# Patient Record
Sex: Female | Born: 1950 | Race: White | Hispanic: No | Smoking: Former smoker
Health system: Southern US, Community
[De-identification: ages and names within clinical notes are randomized; demographics above are authoritative.]

## PROBLEM LIST (undated history)

## (undated) DIAGNOSIS — D649 Anemia, unspecified: Secondary | ICD-10-CM

## (undated) DIAGNOSIS — F329 Major depressive disorder, single episode, unspecified: Secondary | ICD-10-CM

## (undated) DIAGNOSIS — K219 Gastro-esophageal reflux disease without esophagitis: Secondary | ICD-10-CM

## (undated) DIAGNOSIS — Z5189 Encounter for other specified aftercare: Secondary | ICD-10-CM

## (undated) DIAGNOSIS — T7840XA Allergy, unspecified, initial encounter: Secondary | ICD-10-CM

## (undated) DIAGNOSIS — F419 Anxiety disorder, unspecified: Secondary | ICD-10-CM

## (undated) DIAGNOSIS — F32A Depression, unspecified: Secondary | ICD-10-CM

## (undated) HISTORY — DX: Anxiety disorder, unspecified: F41.9

## (undated) HISTORY — DX: Major depressive disorder, single episode, unspecified: F32.9

## (undated) HISTORY — PX: UPPER GASTROINTESTINAL ENDOSCOPY: SHX188

## (undated) HISTORY — DX: Depression, unspecified: F32.A

## (undated) HISTORY — PX: AUGMENTATION MAMMAPLASTY: SUR837

## (undated) HISTORY — DX: Gastro-esophageal reflux disease without esophagitis: K21.9

## (undated) HISTORY — PX: OTHER SURGICAL HISTORY: SHX169

## (undated) HISTORY — DX: Allergy, unspecified, initial encounter: T78.40XA

## (undated) HISTORY — DX: Anemia, unspecified: D64.9

## (undated) HISTORY — DX: Encounter for other specified aftercare: Z51.89

---

## 1998-02-10 HISTORY — PX: BREAST ENHANCEMENT SURGERY: SHX7

## 2001-03-11 ENCOUNTER — Encounter: Payer: Self-pay | Admitting: Internal Medicine

## 2001-04-01 ENCOUNTER — Encounter: Admission: RE | Admit: 2001-04-01 | Discharge: 2001-04-01 | Payer: Self-pay | Admitting: *Deleted

## 2001-04-01 ENCOUNTER — Encounter: Payer: Self-pay | Admitting: *Deleted

## 2001-11-22 ENCOUNTER — Other Ambulatory Visit: Admission: RE | Admit: 2001-11-22 | Discharge: 2001-11-22 | Payer: Self-pay | Admitting: Gynecology

## 2002-04-26 ENCOUNTER — Encounter: Payer: Self-pay | Admitting: Gynecology

## 2002-04-26 ENCOUNTER — Encounter: Admission: RE | Admit: 2002-04-26 | Discharge: 2002-04-26 | Payer: Self-pay | Admitting: Gynecology

## 2002-05-05 ENCOUNTER — Encounter: Payer: Self-pay | Admitting: Internal Medicine

## 2002-05-05 ENCOUNTER — Encounter: Admission: RE | Admit: 2002-05-05 | Discharge: 2002-05-05 | Payer: Self-pay | Admitting: Internal Medicine

## 2002-12-26 ENCOUNTER — Other Ambulatory Visit: Admission: RE | Admit: 2002-12-26 | Discharge: 2002-12-26 | Payer: Self-pay | Admitting: Gynecology

## 2003-04-27 ENCOUNTER — Encounter: Admission: RE | Admit: 2003-04-27 | Discharge: 2003-04-27 | Payer: Self-pay | Admitting: Gynecology

## 2003-06-27 ENCOUNTER — Encounter: Admission: RE | Admit: 2003-06-27 | Discharge: 2003-07-11 | Payer: Self-pay | Admitting: Neurology

## 2004-02-13 ENCOUNTER — Other Ambulatory Visit: Admission: RE | Admit: 2004-02-13 | Discharge: 2004-02-13 | Payer: Self-pay | Admitting: Gynecology

## 2004-04-29 ENCOUNTER — Encounter: Admission: RE | Admit: 2004-04-29 | Discharge: 2004-04-29 | Payer: Self-pay | Admitting: Internal Medicine

## 2005-05-01 ENCOUNTER — Encounter: Admission: RE | Admit: 2005-05-01 | Discharge: 2005-05-01 | Payer: Self-pay | Admitting: Internal Medicine

## 2005-06-05 ENCOUNTER — Other Ambulatory Visit: Admission: RE | Admit: 2005-06-05 | Discharge: 2005-06-05 | Payer: Self-pay | Admitting: Gynecology

## 2006-06-09 ENCOUNTER — Other Ambulatory Visit: Admission: RE | Admit: 2006-06-09 | Discharge: 2006-06-09 | Payer: Self-pay | Admitting: Gynecology

## 2006-06-11 ENCOUNTER — Encounter: Admission: RE | Admit: 2006-06-11 | Discharge: 2006-06-11 | Payer: Self-pay | Admitting: Gynecology

## 2007-09-08 ENCOUNTER — Encounter (INDEPENDENT_AMBULATORY_CARE_PROVIDER_SITE_OTHER): Payer: Self-pay | Admitting: *Deleted

## 2007-09-08 ENCOUNTER — Encounter: Admission: RE | Admit: 2007-09-08 | Discharge: 2007-09-08 | Payer: Self-pay | Admitting: Internal Medicine

## 2007-09-14 ENCOUNTER — Encounter: Admission: RE | Admit: 2007-09-14 | Discharge: 2007-09-14 | Payer: Self-pay | Admitting: Gynecology

## 2008-01-21 ENCOUNTER — Encounter: Payer: Self-pay | Admitting: Internal Medicine

## 2008-01-21 DIAGNOSIS — Q438 Other specified congenital malformations of intestine: Secondary | ICD-10-CM | POA: Insufficient documentation

## 2008-02-17 ENCOUNTER — Encounter: Admission: RE | Admit: 2008-02-17 | Discharge: 2008-02-17 | Payer: Self-pay | Admitting: Internal Medicine

## 2008-03-12 ENCOUNTER — Emergency Department (HOSPITAL_COMMUNITY): Admission: EM | Admit: 2008-03-12 | Discharge: 2008-03-12 | Payer: Self-pay | Admitting: Family Medicine

## 2008-09-19 ENCOUNTER — Encounter: Admission: RE | Admit: 2008-09-19 | Discharge: 2008-09-19 | Payer: Self-pay | Admitting: Internal Medicine

## 2008-11-10 HISTORY — PX: ESOPHAGOGASTRODUODENOSCOPY W/ BANDING: SHX1530

## 2008-11-27 ENCOUNTER — Inpatient Hospital Stay (HOSPITAL_COMMUNITY): Admission: EM | Admit: 2008-11-27 | Discharge: 2008-12-01 | Payer: Self-pay | Admitting: Emergency Medicine

## 2008-11-27 ENCOUNTER — Ambulatory Visit: Payer: Self-pay | Admitting: Internal Medicine

## 2008-11-28 ENCOUNTER — Encounter (INDEPENDENT_AMBULATORY_CARE_PROVIDER_SITE_OTHER): Payer: Self-pay | Admitting: *Deleted

## 2008-11-28 ENCOUNTER — Encounter: Payer: Self-pay | Admitting: Internal Medicine

## 2008-12-01 ENCOUNTER — Encounter: Payer: Self-pay | Admitting: Internal Medicine

## 2008-12-06 DIAGNOSIS — Z8719 Personal history of other diseases of the digestive system: Secondary | ICD-10-CM | POA: Insufficient documentation

## 2008-12-06 DIAGNOSIS — K449 Diaphragmatic hernia without obstruction or gangrene: Secondary | ICD-10-CM | POA: Insufficient documentation

## 2008-12-06 DIAGNOSIS — Z872 Personal history of diseases of the skin and subcutaneous tissue: Secondary | ICD-10-CM | POA: Insufficient documentation

## 2008-12-06 DIAGNOSIS — K219 Gastro-esophageal reflux disease without esophagitis: Secondary | ICD-10-CM | POA: Insufficient documentation

## 2008-12-06 DIAGNOSIS — D72829 Elevated white blood cell count, unspecified: Secondary | ICD-10-CM | POA: Insufficient documentation

## 2008-12-06 DIAGNOSIS — K222 Esophageal obstruction: Secondary | ICD-10-CM | POA: Insufficient documentation

## 2008-12-06 DIAGNOSIS — F411 Generalized anxiety disorder: Secondary | ICD-10-CM | POA: Insufficient documentation

## 2008-12-06 DIAGNOSIS — K648 Other hemorrhoids: Secondary | ICD-10-CM | POA: Insufficient documentation

## 2008-12-06 DIAGNOSIS — K5730 Diverticulosis of large intestine without perforation or abscess without bleeding: Secondary | ICD-10-CM | POA: Insufficient documentation

## 2008-12-06 DIAGNOSIS — D62 Acute posthemorrhagic anemia: Secondary | ICD-10-CM | POA: Insufficient documentation

## 2008-12-06 DIAGNOSIS — G4700 Insomnia, unspecified: Secondary | ICD-10-CM | POA: Insufficient documentation

## 2008-12-06 DIAGNOSIS — F3289 Other specified depressive episodes: Secondary | ICD-10-CM | POA: Insufficient documentation

## 2008-12-06 DIAGNOSIS — E785 Hyperlipidemia, unspecified: Secondary | ICD-10-CM | POA: Insufficient documentation

## 2008-12-06 DIAGNOSIS — F329 Major depressive disorder, single episode, unspecified: Secondary | ICD-10-CM | POA: Insufficient documentation

## 2008-12-12 ENCOUNTER — Ambulatory Visit: Payer: Self-pay | Admitting: Internal Medicine

## 2008-12-12 LAB — CONVERTED CEMR LAB
HCT: 39.5 % (ref 36.0–46.0)
Hemoglobin: 13.6 g/dL (ref 12.0–15.0)
Lymphocytes Relative: 32.5 % (ref 12.0–46.0)
Lymphs Abs: 1.7 10*3/uL (ref 0.7–4.0)
MCHC: 34.3 g/dL (ref 30.0–36.0)
Monocytes Absolute: 0.5 10*3/uL (ref 0.1–1.0)
Monocytes Relative: 9.7 % (ref 3.0–12.0)
Neutrophils Relative %: 53.1 % (ref 43.0–77.0)
Platelets: 471 10*3/uL — ABNORMAL HIGH (ref 150.0–400.0)

## 2009-01-29 ENCOUNTER — Ambulatory Visit: Payer: Self-pay | Admitting: Internal Medicine

## 2009-01-29 LAB — CONVERTED CEMR LAB
Eosinophils Absolute: 0.1 10*3/uL (ref 0.0–0.7)
Hemoglobin: 13.8 g/dL (ref 12.0–15.0)
Lymphs Abs: 1.3 10*3/uL (ref 0.7–4.0)
Neutro Abs: 2.1 10*3/uL (ref 1.4–7.7)
Platelets: 325 10*3/uL (ref 150.0–400.0)
RBC: 4.25 M/uL (ref 3.87–5.11)
RDW: 14.9 % — ABNORMAL HIGH (ref 11.5–14.6)

## 2009-10-23 ENCOUNTER — Encounter: Admission: RE | Admit: 2009-10-23 | Discharge: 2009-10-23 | Payer: Self-pay | Admitting: Gynecology

## 2010-03-03 ENCOUNTER — Encounter: Payer: Self-pay | Admitting: Internal Medicine

## 2010-03-04 ENCOUNTER — Encounter: Payer: Self-pay | Admitting: Internal Medicine

## 2010-05-16 LAB — SAMPLE TO BLOOD BANK

## 2010-05-16 LAB — CROSSMATCH
ABO/RH(D): O POS
Antibody Screen: NEGATIVE

## 2010-05-16 LAB — CBC
HCT: 15.8 % — ABNORMAL LOW (ref 36.0–46.0)
HCT: 34.5 % — ABNORMAL LOW (ref 36.0–46.0)
Hemoglobin: 12.4 g/dL (ref 12.0–15.0)
Hemoglobin: 13.7 g/dL (ref 12.0–15.0)
Hemoglobin: 5.4 g/dL — CL (ref 12.0–15.0)
MCHC: 33.9 g/dL (ref 30.0–36.0)
MCV: 93.2 fL (ref 78.0–100.0)
MCV: 94.8 fL (ref 78.0–100.0)
Platelets: 109 10*3/uL — ABNORMAL LOW (ref 150–400)
Platelets: 166 10*3/uL (ref 150–400)
Platelets: 174 10*3/uL (ref 150–400)
Platelets: 212 10*3/uL (ref 150–400)
RBC: 2.04 MIL/uL — ABNORMAL LOW (ref 3.87–5.11)
RBC: 4.01 MIL/uL (ref 3.87–5.11)
RDW: 13 % (ref 11.5–15.5)
RDW: 14 % (ref 11.5–15.5)
RDW: 15.3 % (ref 11.5–15.5)
RDW: 15.3 % (ref 11.5–15.5)
WBC: 6.2 10*3/uL (ref 4.0–10.5)
WBC: 8.9 10*3/uL (ref 4.0–10.5)

## 2010-05-16 LAB — HEPATIC FUNCTION PANEL
AST: 20 U/L (ref 0–37)
Albumin: 2.6 g/dL — ABNORMAL LOW (ref 3.5–5.2)
Alkaline Phosphatase: 35 U/L — ABNORMAL LOW (ref 39–117)
Bilirubin, Direct: 0.1 mg/dL (ref 0.0–0.3)
Indirect Bilirubin: 0.3 mg/dL (ref 0.3–0.9)
Total Bilirubin: 0.4 mg/dL (ref 0.3–1.2)

## 2010-05-16 LAB — BASIC METABOLIC PANEL
BUN: 9 mg/dL (ref 6–23)
Calcium: 7.6 mg/dL — ABNORMAL LOW (ref 8.4–10.5)
GFR calc non Af Amer: >60 mL/min (ref >60)
Glucose, Bld: 104 mg/dL — ABNORMAL HIGH (ref 70–99)
Potassium: 3.6 mEq/L (ref 3.5–5.1)

## 2010-05-16 LAB — COMPREHENSIVE METABOLIC PANEL
ALT: 12 U/L (ref 0–35)
ALT: 13 U/L (ref 0–35)
Albumin: 2.4 g/dL — ABNORMAL LOW (ref 3.5–5.2)
Alkaline Phosphatase: 22 U/L — ABNORMAL LOW (ref 39–117)
Alkaline Phosphatase: 30 U/L — ABNORMAL LOW (ref 39–117)
CO2: 31 mEq/L (ref 19–32)
GFR calc non Af Amer: >60 mL/min (ref >60)
Glucose, Bld: 139 mg/dL — ABNORMAL HIGH (ref 70–99)
Glucose, Bld: 99 mg/dL (ref 70–99)
Potassium: 3.3 mEq/L — ABNORMAL LOW (ref 3.5–5.1)
Potassium: 4.1 mEq/L (ref 3.5–5.1)
Sodium: 138 mEq/L (ref 135–145)
Sodium: 141 mEq/L (ref 135–145)
Total Protein: 4.2 g/dL — ABNORMAL LOW (ref 6.0–8.3)
Total Protein: 4.7 g/dL — ABNORMAL LOW (ref 6.0–8.3)

## 2010-05-16 LAB — HEMOGLOBIN AND HEMATOCRIT, BLOOD
HCT: 38.1 % (ref 36.0–46.0)
Hemoglobin: 12.4 g/dL (ref 12.0–15.0)
Hemoglobin: 12.7 g/dL (ref 12.0–15.0)

## 2010-05-16 LAB — DIFFERENTIAL
Basophils Relative: 0 % (ref 0–1)
Eosinophils Absolute: 0 10*3/uL (ref 0.0–0.7)
Lymphocytes Relative: 15 % (ref 12–46)
Lymphs Abs: 0.9 10*3/uL (ref 0.7–4.0)
Neutro Abs: 5.1 10*3/uL (ref 1.7–7.7)
Neutrophils Relative %: 80 % — ABNORMAL HIGH (ref 43–77)

## 2010-05-16 LAB — POCT I-STAT, CHEM 8
Chloride: 104 mEq/L (ref 96–112)
Sodium: 141 mEq/L (ref 135–145)
TCO2: 27 mmol/L (ref 0–100)

## 2010-05-16 LAB — HEMOCCULT GUIAC POC 1CARD (OFFICE): Fecal Occult Bld: POSITIVE

## 2010-05-16 LAB — H. PYLORI ANTIBODY, IGG: H Pylori IgG: 0.46 {ISR}

## 2010-05-16 LAB — PREPARE RBC (CROSSMATCH)

## 2010-05-27 LAB — D-DIMER, QUANTITATIVE: D-Dimer, Quant: 1.14 ug/mL-FEU — ABNORMAL HIGH (ref 0.00–0.48)

## 2010-05-27 LAB — DIFFERENTIAL
Basophils Absolute: 0 10*3/uL (ref 0.0–0.1)
Lymphocytes Relative: 20 % (ref 12–46)
Monocytes Absolute: 0.7 10*3/uL (ref 0.1–1.0)
Neutro Abs: 5.2 10*3/uL (ref 1.7–7.7)
Neutrophils Relative %: 69 % (ref 43–77)

## 2010-05-27 LAB — POCT I-STAT, CHEM 8
Chloride: 100 mEq/L (ref 96–112)
HCT: 37 % (ref 36.0–46.0)
Hemoglobin: 12.6 g/dL (ref 12.0–15.0)
Potassium: 3.7 mEq/L (ref 3.5–5.1)
Sodium: 140 mEq/L (ref 135–145)

## 2010-05-27 LAB — CBC
Hemoglobin: 12.4 g/dL (ref 12.0–15.0)
Platelets: 396 10*3/uL (ref 150–400)
RDW: 13.2 % (ref 11.5–15.5)

## 2011-01-01 ENCOUNTER — Other Ambulatory Visit: Payer: Self-pay | Admitting: Internal Medicine

## 2011-01-01 DIAGNOSIS — Z1231 Encounter for screening mammogram for malignant neoplasm of breast: Secondary | ICD-10-CM

## 2011-02-13 ENCOUNTER — Ambulatory Visit
Admission: RE | Admit: 2011-02-13 | Discharge: 2011-02-13 | Disposition: A | Discharge status: Home / Self Care | Payer: BC Managed Care – PPO | Source: Ambulatory Visit | Attending: Internal Medicine | Admitting: Internal Medicine

## 2011-02-13 DIAGNOSIS — Z1231 Encounter for screening mammogram for malignant neoplasm of breast: Secondary | ICD-10-CM

## 2012-02-12 ENCOUNTER — Other Ambulatory Visit: Payer: Self-pay | Admitting: Internal Medicine

## 2012-02-12 DIAGNOSIS — Z1231 Encounter for screening mammogram for malignant neoplasm of breast: Secondary | ICD-10-CM

## 2012-03-09 ENCOUNTER — Ambulatory Visit
Admission: RE | Admit: 2012-03-09 | Discharge: 2012-03-09 | Disposition: A | Discharge status: Home / Self Care | Payer: Self-pay | Source: Ambulatory Visit | Attending: Internal Medicine | Admitting: Internal Medicine

## 2012-03-09 DIAGNOSIS — Z1231 Encounter for screening mammogram for malignant neoplasm of breast: Secondary | ICD-10-CM

## 2013-03-09 ENCOUNTER — Other Ambulatory Visit: Payer: Self-pay

## 2013-03-09 DIAGNOSIS — Z1231 Encounter for screening mammogram for malignant neoplasm of breast: Secondary | ICD-10-CM

## 2013-04-05 ENCOUNTER — Ambulatory Visit: Payer: BC Managed Care – PPO

## 2013-04-18 ENCOUNTER — Ambulatory Visit
Admission: RE | Admit: 2013-04-18 | Discharge: 2013-04-18 | Disposition: A | Discharge status: Home / Self Care | Payer: BC Managed Care – PPO | Source: Ambulatory Visit

## 2013-04-18 DIAGNOSIS — Z1231 Encounter for screening mammogram for malignant neoplasm of breast: Secondary | ICD-10-CM

## 2013-07-06 ENCOUNTER — Encounter: Payer: Self-pay | Admitting: Gynecology

## 2013-07-06 ENCOUNTER — Ambulatory Visit: Payer: Self-pay | Admitting: Obstetrics and Gynecology

## 2013-07-28 ENCOUNTER — Ambulatory Visit: Payer: Self-pay | Admitting: Obstetrics and Gynecology

## 2013-08-03 ENCOUNTER — Encounter: Payer: Self-pay | Admitting: Gynecology

## 2013-09-21 ENCOUNTER — Encounter: Payer: Self-pay | Admitting: Gynecology

## 2013-09-21 ENCOUNTER — Ambulatory Visit (INDEPENDENT_AMBULATORY_CARE_PROVIDER_SITE_OTHER): Payer: BC Managed Care – PPO | Admitting: Gynecology

## 2013-09-21 ENCOUNTER — Telehealth: Payer: Self-pay | Admitting: Gynecology

## 2013-09-21 VITALS — BP 142/90 | HR 72 | Resp 18 | Ht 60.0 in | Wt 144.0 lb

## 2013-09-21 DIAGNOSIS — Z79890 Hormone replacement therapy (postmenopausal): Secondary | ICD-10-CM

## 2013-09-21 DIAGNOSIS — N951 Menopausal and female climacteric states: Secondary | ICD-10-CM

## 2013-09-21 DIAGNOSIS — Z124 Encounter for screening for malignant neoplasm of cervix: Secondary | ICD-10-CM

## 2013-09-21 DIAGNOSIS — Z01419 Encounter for gynecological examination (general) (routine) without abnormal findings: Secondary | ICD-10-CM

## 2013-09-21 MED ORDER — ESTRADIOL 0.75 MG/1.25 GM (0.06%) TD GEL: 1.2500 g | Freq: Every day | TRANSDERMAL | Status: DC

## 2013-09-21 NOTE — Telephone Encounter (Signed)
Lm to call back re her HRT regimen

## 2013-09-21 NOTE — Progress Notes (Signed)
63 y.o. Separated Caucasian female   [id number] here for annual exam. Pt reports menses are absent due to Menopause.  She does not report hot flashes, does not have night sweats, does not have vaginal dryness.  She is not using lubricants.  She does not report post-menopasual bleeding.  Pt is on estrogel once a day and uses progestin every 79m but has never had a withdraw bleed on about 10y.  Pt was on vagifem but recently discontinued.  Pt has not been sexually active for 3y.     Patient's last menstrual period was 09/21/2001.          Sexually active: No.  The current method of family planning is post menopausal status.    Exercising: Yes.    walking, weights 2x/wk Last pap:  1 year and 1/2 Normal  Abnormal PAP: no Mammogram: 04/20/13 Bi-Rads 1 BSE: no Colonoscopy: 2010- Normal f/u in 5 years DEXA: 07/2012 osteopenia-stable Alcohol: 10 drinks/wk Tobacco: no  Labs: Shon Baton, MD   Health Maintenance  Topic Date Due  . Pap Smear  03/07/1969  . Tetanus/tdap  03/07/1970  . Zostavax  03/08/2011  . Influenza Vaccine  09/10/2013  . Mammogram  03/09/2014  . Colonoscopy  02/16/2018    Family History  Problem Relation Age of Onset  . Colon cancer Maternal Grandmother   . Breast cancer Sister     21 Sister    Patient Active Problem List   Diagnosis Date Noted  . HYPERLIPIDEMIA 12/06/2008  . ANEMIA, SECONDARY TO ACUTE BLOOD LOSS 12/06/2008  . LEUKOCYTOSIS 12/06/2008  . ANXIETY 12/06/2008  . DEPRESSION 12/06/2008  . INTERNAL HEMORRHOIDS 12/06/2008  . 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 GERD 12/06/2008  . HIATAL HERNIA 12/06/2008  . DIVERTICULOSIS OF COLON 12/06/2008  . INSOMNIA UNSPECIFIED 12/06/2008  . GASTROINTESTINAL HEMORRHAGE, HX OF 12/06/2008  . ANAL FISSURE, HX OF 12/06/2008  . 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 Blood transfusion without reported diagnosis     5 Years ago due to internal bleeding  . Depression   .  Anemia     No past surgical history on file.  Allergies: Review of patient's allergies indicates no known allergies.  Current Outpatient Prescriptions  Medication Sig Dispense Refill  . Ascorbic Acid (VITAMIN C PO) Take by mouth.      . Cholecalciferol (VITAMIN D PO) Take by mouth.      . escitalopram (LEXAPRO) 10 MG tablet daily.      Marland Kitchen ESTROGEL 0.75 MG/1.25 GM (0.06%) topical gel       . Eszopiclone 3 MG TABS       . Multiple Vitamins-Minerals (MULTIVITAMIN PO) Take by mouth.      . Omega-3 Fatty Acids (FISH OIL PEARLS PO) Take by mouth.      . Probiotic Product (PROBIOTIC DAILY PO) Take by mouth.       No current facility-administered medications for this visit.    ROS: Pertinent items are noted in HPI.  Exam:    BP 142/90  Pulse 72  Resp 18  Ht 5' (1.524 m)  Wt 144 lb (65.318 kg)  BMI 28.12 kg/m2  LMP 09/21/2001 Weight change: @WEIGHTCHANGE @ Last 3 height recordings:  Ht Readings from Last 3 Encounters:  09/21/13 5' (1.524 m)  12/12/08 5' (1.524 m)   General appearance: alert, cooperative and appears stated age Head: Normocephalic, without obvious abnormality, atraumatic Neck: no adenopathy, no carotid bruit, no JVD, supple,  symmetrical, trachea midline and thyroid not enlarged, symmetric, no tenderness/mass/nodules Lungs: clear to auscultation bilaterally Breasts: normal appearance, no masses or tenderness Heart: regular rate and rhythm, S1, S2 normal, no murmur, click, rub or gallop Abdomen: soft, non-tender; bowel sounds normal; no masses,  no organomegaly Extremities: extremities normal, atraumatic, no cyanosis or edema Skin: Skin color, texture, turgor normal. No rashes or lesions Lymph nodes: Cervical, supraclavicular, and axillary nodes normal. no inguinal nodes palpated Neurologic: Grossly normal   Pelvic: External genitalia:  no lesions              Urethra: normal appearing urethra with no masses, tenderness or lesions              Bartholins and  Skenes: Bartholin's, Urethra, Skene's normal                 Vagina: normal appearing vagina with normal color and discharge, no lesions              Cervix: normal appearance              Pap taken: Yes.          Bimanual Exam:  Uterus:  uterus is normal size, shape, consistency and nontender, absent                                      Adnexa:    normal adnexa in size, nontender and no masses                                      Rectovaginal: Confirms                                      Anus:  normal sphincter tone, no lesions     1. Routine gynecological examination counseled on breast self exam, mammography screening, menopause, adequate intake of calcium and vitamin D, diet and exercise return annually or prn Discussed PAP guideline changes, importance of weight bearing exercises, calcium, vit D and balanced diet.   2. Hormone replacement therapy We reviewed the recommendations of ACOG and WHI re HRT, risks and benefits reviewed, affects on bone, colon, cholesterol and menopausal symptoms discussed.  Risks of breast, uterine cancer, reviewed. We also discussed non-hormonal options such as SSRI's, SSRI/SNRI's such as effexor and mechanism of action.  Questions addressed.  Pt chooses to continue her HRT at present and refills were sent in. Pt is under a lot of stress with her recent financial and marital issues-prefers to not change at this time, will call office with erh progesterone-forgot name Role of vaginal estrogen reviewed, as she is not sexually active, can not resume, can try otc vit e, cocoanut oil as needed   - Estradiol (ESTROGEL) 0.75 MG/1.25 GM (0.06%) topical gel; Place 1.25 g onto the skin daily. 1 pump to inner arm once a day  Dispense: 1 Bottle; Refill: 12  3. Screening for cervical cancer Guidelines reviewed - Pap Test with HP (IPS)  An After Visit Summary was printed and given to the patient.   Addendum: After pt left this afternoon, I called [employer] and  [employer], pt is on crinlone 8% every other day for every 4th month.  TC to pt regarding  this regimen, left message to call back

## 2013-09-23 LAB — IPS PAP TEST WITH HPV

## 2013-09-26 MED ORDER — PROGESTERONE MICRONIZED 200 MG PO CAPS: 200.0000 mg | ORAL_CAPSULE | Freq: Every day | ORAL | Status: DC

## 2013-09-26 MED ORDER — ESTRADIOL 0.75 MG/1.25 GM (0.06%) TD GEL: 1.2500 g | Freq: Every day | TRANSDERMAL | Status: DC

## 2013-09-26 NOTE — Telephone Encounter (Signed)
RX for Progesterone 200 mg #14/11 refills printed out and was faxed to [facility].

## 2013-09-26 NOTE — Telephone Encounter (Signed)
Pt called back, informed that her regimen of crinolone every other day for 50m every t\4th month may not be sufficient to prevent hyperplasia and as such would recommend either daily progestin or 2w/m, pt agreeable to either, we will continue her estragel, prometrium called in

## 2013-09-26 NOTE — Telephone Encounter (Signed)
LM to call back regarding progestin

## 2013-10-12 ENCOUNTER — Telehealth: Payer: Self-pay | Admitting: Gynecology

## 2013-10-12 DIAGNOSIS — Z79890 Hormone replacement therapy (postmenopausal): Secondary | ICD-10-CM

## 2013-10-12 DIAGNOSIS — N951 Menopausal and female climacteric states: Secondary | ICD-10-CM

## 2013-10-12 MED ORDER — ESTRADIOL 0.75 MG/1.25 GM (0.06%) TD GEL: 1.2500 g | Freq: Every day | TRANSDERMAL | Status: DC

## 2013-10-12 MED ORDER — PROGESTERONE MICRONIZED 200 MG PO CAPS: 200.0000 mg | ORAL_CAPSULE | Freq: Every day | ORAL | Status: DC

## 2013-10-12 NOTE — Telephone Encounter (Signed)
Pt calling saying that the pharmacy is telling her they do not have these rx. Could you please resend them  Estradiol (ESTROGEL) 0.75 MG/1.25 GM (0.06%) topical gel  Place 1.25 g onto the skin daily. 1 pump to inner arm once a day, Starting 09/26/2013, Until Discontinued, Normal, Last Dose: Not Recorded  Refills: 12 ordered Pharmacy: [place], [place] - [address]  progesterone (PROMETRIUM) 200 MG capsule  Take 1 capsule (200 mg total) by mouth daily. Take daily for 14 days each month. Start on the first day of the month and take until the 14th day., Starting 09/26/2013, Until Discontinued, Print, Last Dose: Not Recorded  Refills: 11 ordered Pharmacy: [place], [place] - [address]

## 2013-10-12 NOTE — Telephone Encounter (Signed)
Rx for Prometrium 200mg  #14 11RF faxed to [phone]. Rx for Estrogel 0.75mg /1.25gm sent electronically. Spoke with patient. Advised both prescriptions have been resent to [facility]. Patient is agreeable.  Routing to provider for final review. Patient agreeable to disposition. Will close encounter

## 2013-10-12 NOTE — Telephone Encounter (Signed)
Medications reorder to [facility]. Rx for prometrium to Dr.lathrop's desk for signature to fax as it can not be sent electronically.

## 2013-10-26 ENCOUNTER — Telehealth: Payer: Self-pay | Admitting: Gynecology

## 2013-10-26 NOTE — Telephone Encounter (Signed)
Left message to call Kaitlyn at [phone]. 

## 2013-10-26 NOTE — Telephone Encounter (Signed)
Pt is having some side effects from the progesterone pill.

## 2013-10-26 NOTE — Telephone Encounter (Signed)
She is currently on cyclic prometrium, is is associated with sleepiness, she can take at night or change to daily treatment which is a lower dose-100 instead of 200. Or we can change progestin entirely, like to provera which may have some negative side effects

## 2013-10-26 NOTE — Telephone Encounter (Signed)
Spoke with patient. Patient states that she started taking prometrium this month and she has not been sleeping well, feels more depressed, and has random dizziness. States that depression is the main concern because she is already on an antidepressant. "I was taking something every three months and now I am taking this every month. I want to switch back or to something else."  Advised patient would route message to Dr.Lathop with request and give patient a call back with further recommendations. patient agreeable.

## 2013-10-27 NOTE — Telephone Encounter (Signed)
Pt is calling kaitlyn back said it was okay for kaitlyn to call her tomorrow

## 2013-10-28 NOTE — Telephone Encounter (Signed)
Spoke with patient. Advised of message as seen below from Dr.Lathrop. Patient is agreeable and verbalizes understanding. Patient would like to try taking her current rx at night next month to see if that will help. Patient will call back next month if she is still having symptoms and would like to try doing something else.  Routing to provider for final review. Patient agreeable to disposition. Will close encounter

## 2013-11-07 ENCOUNTER — Telehealth: Payer: Self-pay | Admitting: Gynecology

## 2013-11-07 NOTE — Telephone Encounter (Signed)
Patient is requesting a new prescription. Patient would like a prescription for testosterone.

## 2013-11-07 NOTE — Telephone Encounter (Signed)
Spoke with patient. She states she forgot to let Dr. Charlies Constable know that she is on Testosterone cream as well at last visit. Per patient bottle states Testosterone Cream 4%. She has this filled at [facility].  Patient uses pea sized amount nightly on inner thigh.  Patient has this filled at [facility].   Patient would like to know if Dr. Charlies Constable would like her to continue.  Would like to know if our office can send refill.  Spoke with [facility]. Last fill was in 2014. Testosterone 4% in PLO base, dispense 30 grams.  Routing to Dr. Charlies Constable.  Does patient need office visit or lab work for refill?

## 2013-11-17 NOTE — Telephone Encounter (Signed)
Rerouting to Dr. Charlies Constable for review.   Prior rx was received from [facility] and given to Dr. Adin Hector on 11/07/13

## 2013-11-18 NOTE — Telephone Encounter (Signed)
Does she want to try estrotest as a single pill and avoid 2 topicals? Does she want to try being off testosterone? If she has been on testosterone, should get a level done before filling new rx

## 2013-11-21 MED ORDER — PROGESTERONE MICRONIZED 100 MG PO CAPS: 100.0000 mg | ORAL_CAPSULE | Freq: Every day | ORAL | Status: DC

## 2013-11-21 NOTE — Telephone Encounter (Signed)
Spoke with patient and message from Dr. Charlies Constable discussed. Patient declines office visit to discuss further. She has decided she will stop taking testosterone cream.   Patient states that she has been having nightmares and feels it is r/t to prometrium.   Patient wants to know if she can stop Prometrium 200 mg days 1-15 and start taking 100 mg daily?  Patient wants return call to cell phone with detailed message.

## 2013-11-21 NOTE — Telephone Encounter (Signed)
Message left to return call to Shellene Sweigert at [phone].    

## 2013-11-21 NOTE — Telephone Encounter (Signed)
We can try and see how nightly lower dose works, I will send it in

## 2013-11-21 NOTE — Telephone Encounter (Signed)
Patient notified of change and new instructions. Advised to call back with any concerns.  Detailed message left per her request.  Routing to provider for final review. Patient agreeable to disposition. Will close encounter

## 2013-12-12 ENCOUNTER — Encounter: Payer: Self-pay | Admitting: Gynecology

## 2014-04-13 ENCOUNTER — Other Ambulatory Visit: Payer: Self-pay

## 2014-04-13 DIAGNOSIS — Z1231 Encounter for screening mammogram for malignant neoplasm of breast: Secondary | ICD-10-CM

## 2014-04-24 ENCOUNTER — Ambulatory Visit
Admission: RE | Admit: 2014-04-24 | Discharge: 2014-04-24 | Disposition: A | Discharge status: Home / Self Care | Payer: 59 | Source: Ambulatory Visit

## 2014-04-24 DIAGNOSIS — Z1231 Encounter for screening mammogram for malignant neoplasm of breast: Secondary | ICD-10-CM

## 2014-08-22 ENCOUNTER — Telehealth: Payer: Self-pay | Admitting: Obstetrics & Gynecology

## 2014-08-22 NOTE — Telephone Encounter (Signed)
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 This is an existing patient.

## 2014-08-31 ENCOUNTER — Encounter: Payer: Self-pay | Admitting: Obstetrics and Gynecology

## 2014-08-31 ENCOUNTER — Ambulatory Visit (INDEPENDENT_AMBULATORY_CARE_PROVIDER_SITE_OTHER): Payer: 59 | Admitting: Obstetrics and Gynecology

## 2014-08-31 VITALS — BP 124/86 | HR 64 | Resp 16 | Wt 134.0 lb

## 2014-08-31 DIAGNOSIS — Z79890 Hormone replacement therapy (postmenopausal): Secondary | ICD-10-CM

## 2014-08-31 NOTE — Patient Instructions (Signed)
Try taking the Estrogel every other day for a week, if doing well try taking it every 3 days Continue taking the prometrium F/U in 1 month for an annual exam Use a lubricant if sexually active

## 2014-08-31 NOTE — Progress Notes (Signed)
Patient ID: Nina Martinez, female   DOB: [birth year], 64 y.o.   MRN: [id number] GYNECOLOGY  VISIT   HPI: 64 y.o.   Divorced  Caucasian  female   [phone] with Patient's last menstrual period was 09/21/2001.   here concerned about vaginal dryness. She has recently entered in a new relationship and has not been sexually active in a few years. She use to be on Vagifem for dryness and thinks she may need this again if she decides to become sexually active soon. She has been single for 2 years. No baseline vaginal dryness. She is on system estrogen and progesterone. She has been on HRT long term, feels well on it. In the past she went off the HRT and lexapro and had severe mood changes. She hasn't tried going off just the HRT.   GYNECOLOGIC HISTORY: Patient's last menstrual period was 09/21/2001. Contraception:Post Menopause Menopausal hormone therapy: Estradiol Gel, Prometrium  Last mammogram: 04-25-14 WNL Last pap smear: 09-21-13 WNL         OB History    Gravida Para Term Preterm AB TAB SAB Ectopic Multiple Living   3 2 2  1  1   2          Patient Active Problem List   Diagnosis Date Noted  . HYPERLIPIDEMIA 12/06/2008  . ANEMIA, SECONDARY TO ACUTE BLOOD LOSS 12/06/2008  . LEUKOCYTOSIS 12/06/2008  . ANXIETY 12/06/2008  . DEPRESSION 12/06/2008  . INTERNAL HEMORRHOIDS 12/06/2008  . 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 GERD 12/06/2008  . HIATAL HERNIA 12/06/2008  . DIVERTICULOSIS OF COLON 12/06/2008  . INSOMNIA UNSPECIFIED 12/06/2008  . GASTROINTESTINAL HEMORRHAGE, HX OF 12/06/2008  . ANAL FISSURE, HX OF 12/06/2008  . 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 Blood transfusion without reported diagnosis     5 Years ago due to internal bleeding  . Depression   . Anemia     Past Surgical History  Procedure Laterality Date  . Esophagogastroduodenoscopy w/ banding  11/2008    duodenal divericular bleed-clipped  . 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 Ascorbic Acid (VITAMIN C PO) Take by mouth daily.     . Cholecalciferol (VITAMIN D PO) Take by mouth daily.     Marland Kitchen escitalopram (LEXAPRO) 10 MG tablet daily.    . Estradiol (ESTROGEL) 0.75 MG/1.25 GM (0.06%) topical gel Place 1.25 g onto the skin daily. 1 pump to inner arm once a day 1 Bottle 12  . Eszopiclone 3 MG TABS     . Multiple Vitamins-Minerals (MULTIVITAMIN PO) Take by mouth.    . Omega-3 Fatty Acids (FISH OIL PO) Take by mouth daily.    . Probiotic Product (PROBIOTIC DAILY PO) Take by mouth daily.     . progesterone (PROMETRIUM) 100 MG capsule Take 1 capsule (100 mg total) by mouth daily. 30 capsule 11   No current facility-administered medications for this visit.     ALLERGIES: Review of patient's allergies indicates no known allergies.  Family History  Problem Relation Age of Onset  . Colon cancer Maternal Grandmother   . Breast cancer Sister     70 Sister    History   Social History  . Marital Status: Divorced    Spouse Name: N/A  . Number of Children: N/A  . Years of Education: N/A   Occupational History  . Not on  file.   Social History Main Topics  . Smoking status: Never Smoker   . Smokeless tobacco: Never Used  . Alcohol Use: 5.0 oz/week    10 drink(s) per week  . Drug Use: No  . Sexual Activity: No   Other Topics Concern  . Not on file   Social History Narrative    ROS:  Pertinent items are noted in HPI.  PHYSICAL EXAMINATION:    BP 124/86 mmHg  Pulse 64  Resp 16  Wt 134 lb (60.782 kg)  LMP 09/21/2001    General appearance: alert, cooperative and appears stated age  Pelvic: External genitalia:  no lesions              Urethra:  normal appearing urethra with no masses, tenderness or lesions              Bartholins and Skenes: normal                 Vagina: normal appearing vagina with normal color and discharge, no lesions. Able  to comfortably insert a pediatric speculum and 2 fingers into the vagina. Well estrogenized.               Cervix: no lesions               Bimanual Exam:  Uterus:  normal size, contour, position, consistency, mobility, non-tender and anteverted              Adnexa: normal adnexa and no mass, fullness, tenderness               Chaperone was present for exam.  ASSESSMENT HRT, discussed recommendations of HRT use and risks involved.  Concern over vaginal dryness and plans for intercourse, currently vagina is well estrogenized No apparent need for vagifem at this time    PLAN  Use a lubricant with intercourse Try weaning off of the estrogen, continue to take the prometrium If she weans off the HRT and develops vaginal atrophy, can restart vagifem F/U in 1 month for an annual exam  An After Visit Summary was printed and given to the patient. 15 minutes face to face time of which over 50% was spent in counseling.

## 2014-09-27 ENCOUNTER — Ambulatory Visit: Payer: Self-pay | Admitting: Certified Nurse Midwife

## 2014-09-27 ENCOUNTER — Ambulatory Visit: Payer: Self-pay | Admitting: Gynecology

## 2014-10-04 ENCOUNTER — Ambulatory Visit (INDEPENDENT_AMBULATORY_CARE_PROVIDER_SITE_OTHER): Payer: 59 | Admitting: Obstetrics and Gynecology

## 2014-10-04 ENCOUNTER — Encounter: Payer: Self-pay | Admitting: Obstetrics and Gynecology

## 2014-10-04 VITALS — BP 130/80 | HR 76 | Resp 16 | Ht 59.5 in | Wt 131.0 lb

## 2014-10-04 DIAGNOSIS — Z8739 Personal history of other diseases of the musculoskeletal system and connective tissue: Secondary | ICD-10-CM | POA: Diagnosis not present

## 2014-10-04 DIAGNOSIS — Z13820 Encounter for screening for osteoporosis: Secondary | ICD-10-CM

## 2014-10-04 DIAGNOSIS — Z01419 Encounter for gynecological examination (general) (routine) without abnormal findings: Secondary | ICD-10-CM | POA: Diagnosis not present

## 2014-10-04 DIAGNOSIS — N951 Menopausal and female climacteric states: Secondary | ICD-10-CM | POA: Diagnosis not present

## 2014-10-04 DIAGNOSIS — Z79890 Hormone replacement therapy (postmenopausal): Secondary | ICD-10-CM | POA: Diagnosis not present

## 2014-10-04 DIAGNOSIS — E2839 Other primary ovarian failure: Secondary | ICD-10-CM | POA: Diagnosis not present

## 2014-10-04 NOTE — Progress Notes (Signed)
64 y.o. [id number] DivorcedCaucasianF here for annual exam.   She has been sexually active, not currently together. No pain with intercourse. We discussed weaning off the HRT, she wants to continue, aware of the risks. No vaginal bleeding.   Patient's last menstrual period was 09/21/2001.          Sexually active: Yes.    The current method of family planning is post menopausal status.    Exercising: Yes.    Walking, weights daily Smoker:  former  Health Maintenance: Pap:  09/21/13 Neg. HR HPV:Neg History of abnormal Pap:  no MMG:  04/25/14 BIRADS1:neg Colonoscopy:  ~2010 - per pt - Normal  BMD:   ~ 2011 - Osteopenia  TDaP: ~ 3 years ago - PCP Screening Labs: PCP, Hb today: PCP, Urine today: PCP   reports that she quit smoking about 31 years ago. She has never used smokeless tobacco. She reports that she drinks about 5.0 oz of alcohol per week. She reports that she does not use illicit drugs.  Past Medical History  Diagnosis Date  . Blood transfusion without reported diagnosis     5 Years ago due to internal bleeding  . Depression   . Anemia     Past Surgical History  Procedure Laterality Date  . Esophagogastroduodenoscopy w/ banding  11/2008    duodenal divericular bleed-clipped  . Breast enhancement surgery  7494    silicone inplants    Current Outpatient Prescriptions  Medication Sig Dispense Refill  . Ascorbic Acid (VITAMIN C PO) Take by mouth daily.     . Cholecalciferol (VITAMIN D PO) Take by mouth daily.     Marland Kitchen escitalopram (LEXAPRO) 10 MG tablet daily.    . Estradiol (ESTROGEL) 0.75 MG/1.25 GM (0.06%) topical gel Place 1.25 g onto the skin daily. 1 pump to inner arm once a day 1 Bottle 12  . Eszopiclone 3 MG TABS     . Multiple Vitamins-Minerals (MULTIVITAMIN PO) Take by mouth.    . Omega-3 Fatty Acids (FISH OIL PO) Take by mouth daily.    . Probiotic Product (PROBIOTIC DAILY PO) Take by mouth daily.     . progesterone (PROMETRIUM) 100 MG capsule Take 1 capsule (100 mg  total) by mouth daily. 30 capsule 11   No current facility-administered medications for this visit.    Family History  Problem Relation Age of Onset  . Colon cancer Maternal Grandmother   . Breast cancer Sister     4 Sister    ROS:  Pertinent items are noted in HPI.  Otherwise, a comprehensive ROS was negative.  Exam:   BP 130/80 mmHg  Pulse 76  Resp 16  Ht 4' 11.5" (1.511 m)  Wt 131 lb (59.421 kg)  BMI 26.03 kg/m2  LMP 09/21/2001  Weight change: @WEIGHTCHANGE @ Height:   Height: 4' 11.5" (151.1 cm)  Ht Readings from Last 3 Encounters:  10/04/14 4' 11.5" (1.511 m)  09/21/13 5' (1.524 m)  12/12/08 5' (1.524 m)    General appearance: alert, cooperative and appears stated age Head: Normocephalic, without obvious abnormality, atraumatic Neck: no adenopathy, supple, symmetrical, trachea midline and thyroid normal to inspection and palpation Lungs: clear to auscultation bilaterally Breasts: normal appearance, no masses or tenderness Heart: regular rate and rhythm Abdomen: soft, non-tender; bowel sounds normal; no masses,  no organomegaly Extremities: extremities normal, atraumatic, no cyanosis or edema Skin: Skin color, texture, turgor normal. No rashes or lesions Lymph nodes: Cervical, supraclavicular, and axillary nodes normal. No abnormal inguinal nodes  palpated Neurologic: Grossly normal   Pelvic: External genitalia:  no lesions              Urethra:  normal appearing urethra with no masses, tenderness or lesions              Bartholins and Skenes: normal                 Vagina: normal appearing vagina with normal color and discharge, no lesions              Cervix: no lesions              Pap taken: No. Bimanual Exam:  Uterus:  normal size, contour, position, consistency, mobility, non-tender and anteverted              Adnexa: normal adnexa and no mass, fullness, tenderness               Rectovaginal: Confirms               Anus:  normal sphincter tone, no  lesions  Chaperone was present for exam.  A:  Well Woman with normal exam  H/O osteopenia  Depression, helped with lexapro  HRT, wants to continue   P:   Continue HRT,aware of risks  DEXA  No pap this year  Mammogram and colonoscopy UTD

## 2014-10-05 MED ORDER — ESTRADIOL 0.75 MG/1.25 GM (0.06%) TD GEL: 1.2500 g | Freq: Every day | TRANSDERMAL | Status: DC

## 2014-10-05 MED ORDER — PROGESTERONE MICRONIZED 100 MG PO CAPS
100.0000 mg | ORAL_CAPSULE | Freq: Every day | ORAL | Status: DC
Start: 2014-10-05 — End: 2015-10-10

## 2014-10-10 ENCOUNTER — Other Ambulatory Visit: Payer: Self-pay | Admitting: Obstetrics and Gynecology

## 2014-10-10 NOTE — Telephone Encounter (Signed)
Medication refill request: Lexapro Last AEX:  10/04/14 with JJ Next AEX: 10/10/15 with JJ Last MMG (if hormonal medication request):  Refill authorized: Please advise

## 2015-01-31 ENCOUNTER — Institutional Professional Consult (permissible substitution): Payer: 59 | Admitting: Neurology

## 2015-02-19 ENCOUNTER — Institutional Professional Consult (permissible substitution): Payer: 59 | Admitting: Neurology

## 2015-02-21 ENCOUNTER — Telehealth: Payer: Self-pay

## 2015-02-21 NOTE — Telephone Encounter (Signed)
Appeals letter faxed to [facility] at [phone] with cover sheet and confirmation.

## 2015-02-21 NOTE — Telephone Encounter (Signed)
Spoke with patient. Advised we received a PA form for her Estrogel Rx. PA was denied. Advised her insurance recommends she try Climara patch or Vivelle Dot patch as these are on her formulary. Patient states she has tried these in the past and was allergic to the adhesive. Does not wish to go back on a patch due to reaction. Advised I will write an appeals letter to her insurance company to try to obtain coverage for her Estrogel prescription. Patient is agreeable.  Appeal letter to [place] for review and signature before sending.

## 2015-02-28 ENCOUNTER — Institutional Professional Consult (permissible substitution): Payer: BLUE CROSS/BLUE SHIELD | Admitting: Neurology

## 2015-03-02 NOTE — Telephone Encounter (Signed)
Appeals request for patient's Estrogel was denied. Denial letter to your desk for review.

## 2015-03-02 NOTE — Telephone Encounter (Signed)
We can offer her oral estradiol, or she can pay out of pocket for the estrogel. Reacted to the patch before. See what she would like to do.

## 2015-03-02 NOTE — Telephone Encounter (Signed)
Left message to call Kaitlyn at [phone]. 

## 2015-03-06 NOTE — Telephone Encounter (Signed)
Spoke with patient. Advised of message as seen below from [place]. Patient would like to pay OOP cost for Estrogel at this time. Will return call if this is too costly and she would like to change to an oral Estradiol rx.  Routing to provider for final review. Patient agreeable to disposition. Will close encounter.

## 2015-03-12 ENCOUNTER — Encounter: Payer: Self-pay | Admitting: Neurology

## 2015-03-12 ENCOUNTER — Ambulatory Visit (INDEPENDENT_AMBULATORY_CARE_PROVIDER_SITE_OTHER): Payer: BLUE CROSS/BLUE SHIELD | Admitting: Neurology

## 2015-03-12 VITALS — BP 122/82 | HR 80 | Resp 20 | Ht 60.0 in | Wt 127.0 lb

## 2015-03-12 DIAGNOSIS — R0683 Snoring: Secondary | ICD-10-CM | POA: Insufficient documentation

## 2015-03-12 DIAGNOSIS — G4700 Insomnia, unspecified: Secondary | ICD-10-CM | POA: Insufficient documentation

## 2015-03-12 DIAGNOSIS — G4730 Sleep apnea, unspecified: Secondary | ICD-10-CM | POA: Insufficient documentation

## 2015-03-12 NOTE — Patient Instructions (Signed)
Hypersomnia Hypersomnia is when you feel extremely tired during the day even though you're getting plenty of sleep at night. You may need to take naps during the day, and you may also be extremely difficult to wake up when you are sleeping.  CAUSES  The cause of your hypersomnia may not be known. Hypersomnia may be caused by:   Medicines.  Sleep disorders, such as narcolepsy.  Trauma or injury to your head or nervous system.  Using drugs or alcohol.  Tumors.  Medical conditions, such as depression or hypothyroidism.  Genetics. SIGNS AND SYMPTOMS  The main symptoms of hypersomnia include:   Feeling extremely tired throughout the day.  Being very difficult to wake up.  Sleeping for longer and longer periods.  Taking naps throughout the day. Other symptoms may include:   Feeling:  Restless.  Annoyed.  Anxious.  Low energy.  Having difficulty:  Remembering.  Speaking.  Thinking.  Losing your appetite.  Experiencing hallucinations. DIAGNOSIS  Hypersomnia may be diagnosed by:  Medical history and physical exam. This will include a sleep history.  Completing sleep logs.  Tests may also be done, such as:  Polysomnography.  Multiple sleep latency test (MSLT). TREATMENT  There is no cure for hypersomnia, but treatment can be very effective in helping manage the condition. Treatment may include:  Lifestyle and sleeping strategies to help cope with the condition.  Stimulant medicines.  Treating any underlying causes of hypersomnia. HOME CARE INSTRUCTIONS  Take medicines only as directed by your health care provider.  Schedule short naps for when you feel sleepiest during the day. Tell your employer or teachers that you have hypersomnia. You may be able to adjust your schedule to include time for naps.  Avoid drinking alcohol or caffeinated beverages.  Do not eat a heavy meal before bedtime. Eat at about the same times every day.  Do not drive or  operate heavy machinery if you are sleepy.  Do not swim or go out on the water without a life jacket.  If possible, adjust your schedule so that you do not have to work or be active at night.  Keep all follow-up visits as directed by your health care provider. This is important. SEEK MEDICAL CARE IF:   You have new symptoms.  Your symptoms get worse. SEEK IMMEDIATE MEDICAL CARE IF:  You have serious thoughts of hurting yourself or someone else.   This information is not intended to replace advice given to you by your health care provider. Make sure you discuss any questions you have with your health care provider.   Document Released: 01/17/2002 Document Revised: 02/17/2014 Document Reviewed: 09/01/2013 Elsevier Interactive Patient Education 2016 Elsevier Inc.  

## 2015-03-12 NOTE — Progress Notes (Signed)
Chief Complaint  Patient presents with  . New Patient (Initial Visit)    pt snores, pt's partner statest that she stops breathing, rm 10, alone    [facility]   Provider:  Larey Seat, M D  Referring Provider: Shon Baton, MD Primary Care Physician:  Precious Reel, MD  Chief Complaint  Patient presents with  . New Patient (Initial Visit)    pt snores, pt's partner statest that she stops breathing, rm 10, alone    HPI:  Nina Martinez is a 65 y.o. female , seen here as a referral from Dr. Virgina Martinez for a sleep evaluation, Nina Martinez has been witnessed to snore loudly, and she remembers as early as about 5 years ago when she was hospitalized for a GI bleed there were comments made by the nursing staff that she snored loudly. Apparently there was no report of apnea. Her former spouse had confirmed that she snored at home. In the meantime Nina Martinez's fianc has been concerned about her irregular breathing. He has witnessed apneas and the patient herself reports waking up gasping for air. She does not feel that her ears the past the same restorative quality it used to have. She lost her sister 6 years ago and developed an insomnia, and soon after went through a divorce. She was prescribed SSRI and Lunesta.   Sleep habits are as follows:  Going to bed between 10.30 and 11 Pm , she usually goes to sleep promptly on Lunesta. Her sleep lasts for 3-4 hours and she can go right back to sleep, having only one bathroom break if any. No night mares or vivid dreams.  She rises at 7 AM and wakes spontaneously but has a background alarm.  No headaches and no dry mouth in AM, but she feels tired when getting up. She has only one arousal for sleep, sleeping for 7 hours nightly. Her bedroom is cool, quiet and dark. She has adult children and no pets. She feels that sleep can come easy all times of the day. She doesn't struggle not to sleep. No sleep attacks.   Sleep medical history and  family sleep history: no history of night terrors, one time sleep walker. No family history of a sleep disorder.    Social history: divorced, at this time engaged.  10 alcoholic beverages a week, no tobacco, caffeine use in AM and stops at lunch.  Review of Systems: Out of a complete 14 system review, the patient complains of only the following symptoms, and all other reviewed systems are negative. Snoring, apnea tired ness.   Epworth score  0 , Fatigue severity score 9  , PHq 2 depression score 0   Social History   Social History  . Marital Status: Divorced    Spouse Name: N/A  . Number of Children: N/A  . Years of Education: N/A   Occupational History  .      [employer]   Social History Main Topics  . Smoking status: Former Smoker    Quit date: 02/11/1983  . Smokeless tobacco: Never Used  . Alcohol Use: 5.0 oz/week    10 Standard drinks or equivalent per week  . Drug Use: No  . Sexual Activity:    Partners: Male    Birth Control/ Protection: Post-menopausal   Other Topics Concern  . Not on file   Social History Narrative   Drinks 3-4 cups of coffee daily.    Family History  Problem Relation Age of Onset  .  Colon cancer Maternal Grandmother   . Breast cancer Sister     74 Sister    Past Medical History  Diagnosis Date  . Blood transfusion without reported diagnosis     5 Years ago due to internal bleeding  . Depression   . Anemia     Past Surgical History  Procedure Laterality Date  . Esophagogastroduodenoscopy w/ banding  11/2008    duodenal divericular bleed-clipped  . 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 Ascorbic Acid (VITAMIN C PO) Take by mouth daily.     . Cholecalciferol (VITAMIN D PO) Take by mouth daily.     Marland Kitchen escitalopram (LEXAPRO) 10 MG tablet TAKE ONE TABLET EACH DAY 90 tablet 3  . Estradiol (ESTROGEL) 0.75 MG/1.25 GM (0.06%) topical gel Place  1.25 g onto the skin daily. 1 pump to inner arm once a day 3 Bottle 3  . Eszopiclone 3 MG TABS     . Multiple Vitamins-Minerals (MULTIVITAMIN PO) Take by mouth.    . Omega-3 Fatty Acids (FISH OIL PO) Take by mouth daily.    . Probiotic Product (PROBIOTIC DAILY PO) Take by mouth daily.     . progesterone (PROMETRIUM) 100 MG capsule Take 1 capsule (100 mg total) by mouth daily. 90 capsule 3   No current facility-administered medications for this visit.    Allergies as of 03/12/2015  . (No Known Allergies)    Vitals: BP 122/82 mmHg  Pulse 80  Resp 20  Ht 5' (1.524 m)  Wt 127 lb (57.607 kg)  BMI 24.80 kg/m2  LMP 09/21/2001 Last Weight:  Wt Readings from Last 1 Encounters:  03/12/15 127 lb (57.607 kg)   [id number] mass index is 24.8 kg/(m^2).     Last Height:   Ht Readings from Last 1 Encounters:  03/12/15 5' (1.524 m)    Physical exam:  General: The patient is awake, alert and appears not in acute distress. The patient is well groomed. Head: Normocephalic, atraumatic. Neck is supple. Mallampati 2  neck circumference:13  Nasal airflow unrestricited , TMJ is not  evident . Retrognathia is not seen. No retainer or braces used.  Cardiovascular:  Regular rate and rhythm, without  murmurs or carotid bruit, and without distended neck veins. Respiratory: Lungs are clear to auscultation. Skin:  Without evidence of edema, or rash Trunk: BMI is normal . The patient's posture is erect   Neurologic exam : The patient is awake and alert, oriented to place and time.   Memory subjective described as intact.  Attention span & concentration ability appears normal.  Speech is fluent,  without  dysarthria, dysphonia or aphasia.  Mood and affect are appropriate.  Cranial nerves: Pupils are equal and briskly reactive to light. Extraocular movements  in vertical and horizontal planes intact and without nystagmus. Visual fields by finger perimetry are intact. Hearing to finger rub intact.    Facial sensation intact to fine touch.  Facial motor strength is symmetric and tongue and uvula move midline. Shoulder shrug was symmetrical.   Motor exam:  Normal tone, muscle bulk and symmetric strength in all extremities. Sensory:  Fine touch, pinprick and vibration were tested in all extremities. Proprioception tested in the upper extremities was normal. Coordination: Rapid alternating movements , finger-to-nose maneuver normal without evidence of ataxia, dysmetria or tremor. Gait and station: Patient walks without assistive device and is able unassisted to climb up to the  exam table. Strength within normal limits.  Stance is stable and normal. Tandem gait is unfragmented. Turns with 3 Steps.  Deep tendon reflexes: in the  upper and lower extremities are symmetric and intact. Babinski maneuver response is  downgoing.  The patient was advised of the nature of the diagnosed sleep disorder , the treatment options and risks for general a health and wellness arising from not treating the condition.  I spent more than 30 minutes of face to face time with the patient. Greater than 50% of time was spent in counseling and coordination of care. We have discussed the diagnosis and differential and I answered the patient's questions.     Assessment:  After physical and neurologic examination, review of laboratory studies,  Personal review of imaging studies, reports of other /same Imaging studies, Results of polysomnography/ neurophysiology testing and pre-existing records as far as provided in visit., my assessment is   1)  Fatigue in daytime , but good quality to her nocturnal sleep. She has woken form her own snoring, has been witnessed to have apnea and snoring - GI bleed may have caused anemia and protracted sleepiness related to that. .   2)  not obese, no large neck and unrestricted nasal passage.    Plan:  Treatment plan and additional workup : apnea screening test.   SPLIT night study ,  please arrange for a quiet bedroom. Patient will use her Lunesta from home. Please have the patient sleep on the back before 1 AM, she prefers prone and side sleep.    Asencion Partridge Rainelle Sulewski MD  03/12/2015   CC: Shon Baton, [place], [place] [address]

## 2015-03-21 ENCOUNTER — Other Ambulatory Visit: Payer: Self-pay

## 2015-03-21 DIAGNOSIS — Z1231 Encounter for screening mammogram for malignant neoplasm of breast: Secondary | ICD-10-CM

## 2015-04-09 ENCOUNTER — Encounter: Payer: Self-pay | Admitting: Gastroenterology

## 2015-04-18 ENCOUNTER — Telehealth: Payer: Self-pay

## 2015-04-18 NOTE — Telephone Encounter (Signed)
Spoke with patient. Advised we have received notification from [facility] stating "The denial for the [employer] has been overturned." Appeal request for Nina Martinez has been approved from April 12, 2015-February 09, 2038. Reference number is [id number]. Patient has been notified and is agreeable. Approval letter to [place] for review and signature for scan.  Routing to provider for final review. Patient agreeable to disposition. Will close encounter.

## 2015-04-25 ENCOUNTER — Ambulatory Visit
Admission: RE | Admit: 2015-04-25 | Discharge: 2015-04-25 | Disposition: A | Discharge status: Home / Self Care | Payer: BLUE CROSS/BLUE SHIELD | Source: Ambulatory Visit

## 2015-04-25 DIAGNOSIS — Z1231 Encounter for screening mammogram for malignant neoplasm of breast: Secondary | ICD-10-CM

## 2015-04-27 ENCOUNTER — Other Ambulatory Visit: Payer: Self-pay | Admitting: Internal Medicine

## 2015-04-27 ENCOUNTER — Telehealth: Payer: Self-pay | Admitting: Neurology

## 2015-04-27 DIAGNOSIS — R928 Other abnormal and inconclusive findings on diagnostic imaging of breast: Secondary | ICD-10-CM

## 2015-04-27 DIAGNOSIS — F5102 Adjustment insomnia: Secondary | ICD-10-CM

## 2015-04-27 DIAGNOSIS — R5383 Other fatigue: Secondary | ICD-10-CM

## 2015-04-27 NOTE — Telephone Encounter (Signed)
Patient called to schedule her sleep study.  Can I get an order so I can schedule?

## 2015-05-04 ENCOUNTER — Ambulatory Visit
Admission: RE | Admit: 2015-05-04 | Discharge: 2015-05-04 | Disposition: A | Discharge status: Home / Self Care | Payer: BLUE CROSS/BLUE SHIELD | Source: Ambulatory Visit | Attending: Internal Medicine | Admitting: Internal Medicine

## 2015-05-04 DIAGNOSIS — R928 Other abnormal and inconclusive findings on diagnostic imaging of breast: Secondary | ICD-10-CM

## 2015-06-12 ENCOUNTER — Ambulatory Visit (INDEPENDENT_AMBULATORY_CARE_PROVIDER_SITE_OTHER): Payer: BLUE CROSS/BLUE SHIELD | Admitting: Neurology

## 2015-06-12 DIAGNOSIS — G4730 Sleep apnea, unspecified: Secondary | ICD-10-CM

## 2015-06-12 DIAGNOSIS — R5383 Other fatigue: Secondary | ICD-10-CM

## 2015-06-12 DIAGNOSIS — F5102 Adjustment insomnia: Secondary | ICD-10-CM

## 2015-06-15 ENCOUNTER — Telehealth: Payer: Self-pay | Admitting: *Deleted

## 2015-06-15 NOTE — Telephone Encounter (Signed)
Pt wants sleep study results. Please call and advised.[phone]

## 2015-06-15 NOTE — Telephone Encounter (Signed)
Nina Martinez's sleep study was completed on the morning of 06/13/2015 and is not yet available. i will get results by Tuesday. Was the study marked urgent ?

## 2015-06-18 NOTE — Telephone Encounter (Signed)
I called pt to discuss sleep study results. Pt was unavailable and will call back when she is available.

## 2015-06-18 NOTE — Telephone Encounter (Signed)
Spoke to pt. I advised her that her sleep study revealed positional dependent osa and positional treatment is advised and pt should avoid sleeping in the supine position. PAP therapy is to be considered as it would address the second concern of prolonged hypoxia during sleep and can treat snoring. Dr. Brett Fairy recommends proceeding with a CPAP titration study to optimize therapy. Snoring alternative therapies may include an oral appliance or ENT procedure. Weight loss and positional therapy may be entertained. Pt requests a follow up appt to discuss these results further. She is not "thrilled" about starting a cpap, and is asking about an anti-depressant that can be prescribed to treat osa. A follow up appt was made for 5/10 at 10:00. Pt verbalized understanding.

## 2015-06-20 ENCOUNTER — Encounter: Payer: Self-pay | Admitting: Neurology

## 2015-06-20 ENCOUNTER — Ambulatory Visit (INDEPENDENT_AMBULATORY_CARE_PROVIDER_SITE_OTHER): Payer: BLUE CROSS/BLUE SHIELD | Admitting: Neurology

## 2015-06-20 VITALS — BP 128/80 | HR 82 | Resp 20 | Ht 60.0 in | Wt 123.0 lb

## 2015-06-20 DIAGNOSIS — G4730 Sleep apnea, unspecified: Secondary | ICD-10-CM

## 2015-06-20 DIAGNOSIS — R0683 Snoring: Secondary | ICD-10-CM | POA: Diagnosis not present

## 2015-06-20 NOTE — Progress Notes (Signed)
Chief Complaint  Patient presents with  . Follow-up    discuss sleep study results, rm [place]   Provider:  Larey Seat, M D  Referring Provider: Shon Baton, MD Primary Care Physician:  Precious Reel, MD  Chief Complaint  Patient presents with  . Follow-up    discuss sleep study results, rm 10    HPI:  Nina Martinez is a 65 y.o. female , seen here as a referral from Dr. Virgina Jock for a sleep evaluation, Nina Martinez has been witnessed to snore loudly, and she remembers as early as about 5 years ago when she was hospitalized for a GI bleed there were comments made by the nursing staff that she snored loudly. Apparently there was no report of apnea. Her former spouse had confirmed that she snored at home. In the meantime Nina Martinez's fianc has been concerned about her irregular breathing. He has witnessed apneas and the patient herself reports waking up gasping for air. She does not feel that her ears the past the same restorative quality it used to have. She lost her sister 6 years ago and developed an insomnia, and soon after went through a divorce. She was prescribed SSRI and Lunesta.   Sleep habits are as follows:  Going to bed between 10.30 and 11 Pm , she usually goes to sleep promptly on Lunesta. Her sleep lasts for 3-4 hours and she can go right back to sleep, having only one bathroom break if any. No night mares or vivid dreams.  She rises at 7 AM and wakes spontaneously but has a background alarm.  No headaches and no dry mouth in AM, but she feels tired when getting up. She has only one arousal for sleep, sleeping for 7 hours nightly. Her bedroom is cool, quiet and dark. She has adult children and no pets. She feels that sleep can come easy all times of the day. She doesn't struggle not to sleep. No sleep attacks.   Sleep medical history and family sleep history: no history of night terrors, one time sleep walker. No family history of a sleep  disorder.   Social history: divorced, at this time engaged. 10 alcoholic beverages a week, no tobacco, caffeine use in AM and stops at lunch.  06-20-2015, We are meeting today to review the recent polysomnography, performed on 06/12/2015. The patient had a very mild degree of sleep apnea. Recorded for 21 obstructive sleep apneas and 29 hypopneas. Interestingly only one of her apneas occurred during rapid eye movement sleep and 49 events in non-REM sleep. She slept most of the night in supine position and the supine AHI was 16 versus an AHI of 0.4 when sleeping on her side or prone. The conclusion was that the patient should avoid supine sleep, and that positive airway pressure can be considered should she have orthopedic reasons or pain keeping her from sleeping on her side. The total period of hypoxemia is 99 minutes during the sleep recording was also closely related to her position. In addition we discussed alcohol use which would be enjoyed in moderation but can affect the sleep pattern. And to further improve continuity of sleep and experienced sustained sleep I would recommend an antidepressant which helps usually to buildup serotonin and noradrenaline, I have usually good outcomes with Pristiq. I have also suggested using a tennis ball before investing in any fancy equipment to avoid supine sleep. We have discussed how to implement this. Usually, one gets used  to not sleeping on the back after about 14 days of the tennis ball method.  Review of Systems: Out of a complete 14 system review, the patient complains of only the following symptoms, and all other reviewed systems are negative. Snoring, apnea tiredness. Diagnosed with supine sleep apnea and hypoxemia only, no insomnia - she slept well through the night.   Epworth score  0 , Fatigue severity score 9  , PHq 2 depression score 0   Social History   Social History  . Marital Status: Divorced    Spouse Name: N/A  . Number of Children:  N/A  . Years of Education: N/A   Occupational History  .      [employer]   Social History Main Topics  . Smoking status: Former Smoker    Quit date: 02/11/1983  . Smokeless tobacco: Never Used  . Alcohol Use: 5.0 oz/week    10 Standard drinks or equivalent per week  . Drug Use: No  . Sexual Activity:    Partners: Male    Birth Control/ Protection: Post-menopausal   Other Topics Concern  . Not on file   Social History Narrative   Drinks 3-4 cups of coffee daily.    Family History  Problem Relation Age of Onset  . Colon cancer Maternal Grandmother   . Breast cancer Sister     49 Sister    Past Medical History  Diagnosis Date  . Blood transfusion without reported diagnosis     5 Years ago due to internal bleeding  . Depression   . Anemia     Past Surgical History  Procedure Laterality Date  . Esophagogastroduodenoscopy w/ banding  11/2008    duodenal divericular bleed-clipped  . 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 Ascorbic Acid (VITAMIN C PO) Take by mouth daily.     . Cholecalciferol (VITAMIN D PO) Take by mouth daily.     Marland Kitchen escitalopram (LEXAPRO) 10 MG tablet TAKE ONE TABLET EACH DAY 90 tablet 3  . Estradiol (ESTROGEL) 0.75 MG/1.25 GM (0.06%) topical gel Place 1.25 g onto the skin daily. 1 pump to inner arm once a day 3 Bottle 3  . Eszopiclone 3 MG TABS     . Multiple Vitamins-Minerals (MULTIVITAMIN PO) Take by mouth.    . Omega-3 Fatty Acids (FISH OIL PO) Take by mouth daily.    . Probiotic Product (PROBIOTIC DAILY PO) Take by mouth daily.     . progesterone (PROMETRIUM) 100 MG capsule Take 1 capsule (100 mg total) by mouth daily. 90 capsule 3   No current facility-administered medications for this visit.    Allergies as of 06/20/2015  . (No Known Allergies)    Vitals: BP 128/80 mmHg  Pulse 82  Resp 20  Ht 5' (1.524 m)  Wt 123 lb (55.792 kg)  BMI 24.02  kg/m2  LMP 09/21/2001 Last Weight:  Wt Readings from Last 1 Encounters:  06/20/15 123 lb (55.792 kg)   [id number] mass index is 24.02 kg/(m^2).     Last Height:   Ht Readings from Last 1 Encounters:  06/20/15 5' (1.524 m)    Physical exam:  General: The patient is awake, alert and appears not in acute distress. The patient is well groomed. Head: Normocephalic, atraumatic. Neck is supple. Mallampati 2  neck circumference:13  Nasal airflow unrestricited , TMJ is not  evident . Retrognathia is not seen. No  retainer or braces used.  Cardiovascular:  Regular rate and rhythm, without  murmurs or carotid bruit, and without distended neck veins. Respiratory: Lungs are clear to auscultation. Skin:  Without evidence of edema, or rash Trunk: BMI is normal . The patient's posture is erect   Neurologic exam : The patient is awake and alert, oriented to place and time.   Memory subjective described as intact. Attention span & concentration ability appears normal.  Speech is fluent,  without  dysarthria, dysphonia or aphasia.  Mood and affect are appropriate. Cranial nerves: Pupils are equal and briskly reactive to light. Extraocular movements  in vertical and horizontal planes intact and without nystagmus. Visual fields by finger perimetry are intact. Hearing to finger rub intact. Facial sensation intact to fine touch.Facial motor strength is symmetric and tongue and uvula move midline. Shoulder shrug was symmetrical.   The patient was advised of the nature of the diagnosed sleep disorder , the treatment options and risks for general a health and wellness arising from not treating the condition.  I spent more than 30 minutes of face to face time with the patient. Greater than 50% of time was spent in counseling and coordination of care. We have discussed the diagnosis and differential and I answered the patient's questions.    Assessment:  After physical and neurologic examination, review of  laboratory studies,  Personal review of imaging studies, reports of other /same Imaging studies, Results of polysomnography/ neurophysiology testing and pre-existing records as far as provided in visit., my assessment is   1)  Fatigue in daytime , but good quality to her nocturnal sleep. Her sleep architecture is very normal, neither is her sleep heavily fragmented, nor does she lack of REM sleep or slow wave sleep. Under the circumstances I think she should stay with her current antidepressant and not changed to Pristiq as I had initially discussed with her. She has purely positional apnea, hypoxia, snoring. Positional treatment is her first treatment option. I explained the tennis ball method and she is keen to try it. GI bleed may have caused anemia and protracted sleepiness related to that. Rayne Du RV needed  Larey Seat MD  06/20/2015   CC: Nina Martinez, [place], [place] [address]

## 2015-10-10 ENCOUNTER — Ambulatory Visit (INDEPENDENT_AMBULATORY_CARE_PROVIDER_SITE_OTHER): Payer: BLUE CROSS/BLUE SHIELD | Admitting: Obstetrics and Gynecology

## 2015-10-10 ENCOUNTER — Encounter: Payer: Self-pay | Admitting: Obstetrics and Gynecology

## 2015-10-10 VITALS — BP 120/80 | HR 64 | Resp 14 | Ht 59.5 in | Wt 136.0 lb

## 2015-10-10 DIAGNOSIS — Z113 Encounter for screening for infections with a predominantly sexual mode of transmission: Secondary | ICD-10-CM

## 2015-10-10 DIAGNOSIS — Z01419 Encounter for gynecological examination (general) (routine) without abnormal findings: Secondary | ICD-10-CM | POA: Diagnosis not present

## 2015-10-10 DIAGNOSIS — Z79890 Hormone replacement therapy (postmenopausal): Secondary | ICD-10-CM | POA: Diagnosis not present

## 2015-10-10 MED ORDER — ESTRADIOL-NORETHINDRONE ACET 0.05-0.25 MG/DAY TD PTTW: 1.0000 | MEDICATED_PATCH | TRANSDERMAL | 12 refills | Status: DC

## 2015-10-10 NOTE — Progress Notes (Signed)
65 y.o. [id number] Legally SeparatedCaucasianF here for annual exam.  She is on HRT, okay with trying to decrease her dose of her gel, she got irritated from the patch in the past. Wouldn't mind trying it again. Would like the patch with the combination estrogen and progesterone.  She is sexually active, dating for 10 months. No pain with intercourse.     Patient's last menstrual period was 09/21/2001.          Sexually active: Yes.    The current method of family planning is post menopausal status.    Exercising: Yes.    walking, weights Smoker:  no  Health Maintenance: Pap:  09/21/13 WNL neg HR HPV History of abnormal Pap:  no MMG:  04/25/15 BIRADS0; 05/04/15 Dx L Breast-BIRADS1 Colonoscopy: ~2010 per pt-Normal  BMD:  ~2011 Osteopenia TDaP:  ~4 years ago-PCP   reports that she quit smoking about 32 years ago. She has never used smokeless tobacco. She reports that she drinks about 4.8 oz of alcohol per week . She reports that she does not use drugs.  She is an Futures trader, works 2 days a week. 2 daughters, one with 3 young boys, the other is [redacted] weeks pregnant with her first baby  Past Medical History:  Diagnosis Date  . Anemia   . Blood transfusion without reported diagnosis    5 Years ago due to internal bleeding  . Depression     Past Surgical History:  Procedure Laterality Date  . BREAST ENHANCEMENT SURGERY  [id number]   silicone inplants  . ESOPHAGOGASTRODUODENOSCOPY W/ BANDING  11/2008   duodenal divericular bleed-clipped    Current Outpatient Prescriptions  Medication Sig Dispense Refill  . Ascorbic Acid (VITAMIN C PO) Take by mouth daily.     . Cholecalciferol (VITAMIN D PO) Take by mouth daily.     Marland Kitchen escitalopram (LEXAPRO) 10 MG tablet TAKE ONE TABLET EACH DAY 90 tablet 3  . Estradiol (ESTROGEL) 0.75 MG/1.25 GM (0.06%) topical gel Place 1.25 g onto the skin daily. 1 pump to inner arm once a day 3 Bottle 3  . Eszopiclone 3 MG TABS     . milk thistle 175 MG tablet Take  175 mg by mouth daily.    . Misc Natural Products (OSTEO BI-FLEX ADV DOUBLE ST PO) Take by mouth.    . Multiple Vitamins-Minerals (MULTIVITAMIN PO) Take by mouth.    . Omega-3 Fatty Acids (FISH OIL PO) Take by mouth daily.    . Probiotic Product (PROBIOTIC DAILY PO) Take by mouth daily.     . progesterone (PROMETRIUM) 100 MG capsule Take 1 capsule (100 mg total) by mouth daily. 90 capsule 3   No current facility-administered medications for this visit.     Family History  Problem Relation Age of Onset  . Colon cancer Maternal Grandmother   . Breast cancer Sister     Half Sister    Review of Systems  Constitutional: Negative.   HENT: Negative.   Eyes: Negative.   Respiratory: Negative.   Cardiovascular: Negative.   Gastrointestinal: Negative.   Endocrine: Negative.   Genitourinary: Negative.   Musculoskeletal: Negative.   Skin: Negative.   Allergic/Immunologic: Negative.   Neurological: Negative.   Hematological: Negative.   Psychiatric/Behavioral: Negative.     Exam:   BP 120/80 (BP Location: Right Arm, Patient Position: Sitting, Cuff Size: Normal)   Pulse 64   Resp 14   Ht 4' 11.5" (1.511 m)   Wt 136 lb (61.7 kg)  LMP 09/21/2001   BMI 27.01 kg/m   Weight change: @WEIGHTCHANGE @ Height:   Height: 4' 11.5" (151.1 cm)  Ht Readings from Last 3 Encounters:  10/10/15 4' 11.5" (1.511 m)  06/20/15 5' (1.524 m)  03/12/15 5' (1.524 m)    General appearance: alert, cooperative and appears stated age Head: Normocephalic, without obvious abnormality, atraumatic Neck: no adenopathy, supple, symmetrical, trachea midline and thyroid normal to inspection and palpation Lungs: clear to auscultation bilaterally Breasts: normal appearance, no masses or tenderness, bilateral implants Heart: regular rate and rhythm Abdomen: soft, non-tender; bowel sounds normal; no masses,  no organomegaly Extremities: extremities normal, atraumatic, no cyanosis or edema Skin: Skin color, texture,  turgor normal. No rashes or lesions Lymph nodes: Cervical, supraclavicular, and axillary nodes normal. No abnormal inguinal nodes palpated Neurologic: Grossly normal   Pelvic: External genitalia:  no lesions              Urethra:  normal appearing urethra with no masses, tenderness or lesions              Bartholins and Skenes: normal                 Vagina: normal appearing atrophic vagina with normal color and discharge, no lesions, mild vaginal wall relaxation              Cervix: no lesions               Bimanual Exam:  Uterus:  normal size, contour, position, consistency, mobility, non-tender              Adnexa: no mass, fullness, tenderness               Rectovaginal: Confirms               Anus:  normal sphincter tone, no lesions  Chaperone was present for exam.  A:  Well Woman with normal exam  On HRT  P:   No pap this year  She wants to continue on HRT, will change to the combipatch, lower dose  Aware of risks of HRT, including clots, stroke, MI, and breast cancer  Mammogram UTD  Colonoscopy UTD  Labs with primary  Discussed breast self exam  Discussed calcium and vit D intake

## 2015-10-10 NOTE — Patient Instructions (Signed)

## 2015-10-11 LAB — GC/CHLAMYDIA PROBE AMP
CT PROBE, AMP APTIMA: NOT DETECTED
GC PROBE AMP APTIMA: NOT DETECTED

## 2015-10-19 ENCOUNTER — Other Ambulatory Visit: Payer: Self-pay | Admitting: Obstetrics and Gynecology

## 2015-10-19 DIAGNOSIS — Z79890 Hormone replacement therapy (postmenopausal): Secondary | ICD-10-CM

## 2015-10-19 DIAGNOSIS — N951 Menopausal and female climacteric states: Secondary | ICD-10-CM

## 2015-10-19 NOTE — Telephone Encounter (Signed)
Medication refill request: Estrogel 0.75mg /1.25gm Last AEX:  10/10/15 JJ Next AEX: 10/18/16 JJ Last MMG (if hormonal medication request): 05/04/15 BIRADS1 Refill authorized: 10/05/14 #3 bottle 3R. Please advise. Thank you.   Sent to [facility] since Dewitt Hoes is out of the office.

## 2015-10-31 ENCOUNTER — Other Ambulatory Visit: Payer: Self-pay | Admitting: Obstetrics and Gynecology

## 2015-10-31 MED ORDER — PROGESTERONE MICRONIZED 100 MG PO CAPS: 100.0000 mg | ORAL_CAPSULE | Freq: Every day | ORAL | 0 refills | Status: DC

## 2015-10-31 NOTE — Telephone Encounter (Signed)
Spoke with patient. Patient states she got a message from insurance that the Combipatch was not covered. Patient states form was sent to our office to be filled and returned for coverage. Patient is requesting refill on progesterone to cover until patch available. Patient states she has 3 pills left and will be leaving to go out of town tomorrow afternoon. Patient reports she is still using estroge, but states no refill needed at this time. Advised patient would review with Dr. Talbert Nan, follow-up with form that was sent, and return call with recommendations. Verified pharmacy on file. Patient is agreeable.

## 2015-10-31 NOTE — Telephone Encounter (Signed)
Patient is calling to find out about her request for progestrone.

## 2015-10-31 NOTE — Telephone Encounter (Signed)
Medication refill request: progesterone  Last AEX:  10/10/15 JJ Next AEX: 10/16/16 JJ Last MMG (if hormonal medication request): 05/04/15 BIRADS1:Neg  Refill authorized: 10/05/14 #90caps/3R. Today please advise.

## 2015-10-31 NOTE — Telephone Encounter (Signed)
Left detailed message per dpr on [phone]. Advised patient prescription for Progesterone sent to pharmacy on file. Please call office for questions or concerns.   Routing to provider for final review. Will close encounter.

## 2015-10-31 NOTE — Telephone Encounter (Signed)
I switched the patient to the combipatch at her last visit, so unless I'm misunderstanding, she doesn't need the prometrium.  Please confirm with the patient she is taking the combipatch and take the estrogel and prometrium off of her list. Thanks.

## 2015-11-01 ENCOUNTER — Telehealth: Payer: Self-pay | Admitting: Obstetrics and Gynecology

## 2015-11-01 NOTE — Telephone Encounter (Signed)
Encounter not needed

## 2015-11-15 ENCOUNTER — Other Ambulatory Visit: Payer: Self-pay | Admitting: Obstetrics and Gynecology

## 2015-11-15 NOTE — Telephone Encounter (Signed)
Patient calling to check the status of a refill for generic Lexapro.

## 2015-11-15 NOTE — Telephone Encounter (Signed)
Medication refill request: lexapro  Last AEX:  10/10/15 JJ Next AEX: 10/16/16 JJ Last MMG (if hormonal medication request): 05/04/15 BIRADS1:neg  Refill authorized: 10/11/14 #90tabs/3R. Today please advise.

## 2015-11-20 ENCOUNTER — Telehealth: Payer: Self-pay | Admitting: Obstetrics and Gynecology

## 2015-11-20 NOTE — Telephone Encounter (Signed)
Please stop the combipatch and call in the vivelle dot, 0.05 mg, apply topically, change 2 x a week. Also call in prometrium 100 mg po qhs. 3 months with refills until her annual exam.

## 2015-11-20 NOTE — Telephone Encounter (Signed)
Patient calling to speak with nurse about refill for the combi patch and the price of it at the pharmacy.

## 2015-11-20 NOTE — Telephone Encounter (Signed)
PA completed for Comipatch was denied by [place] [employer] [place]. Alternative medications must be tried and failed. Alternative medications from the patient's formulary are [place] patch, Vivelle-Dot, Prometrium, and Activella.  Left message to call [place] at [phone].

## 2015-11-28 MED ORDER — ESTRADIOL 0.05 MG/24HR TD PTTW: 1.0000 | MEDICATED_PATCH | TRANSDERMAL | 3 refills | Status: DC

## 2015-11-28 MED ORDER — PROGESTERONE MICRONIZED 100 MG PO CAPS: 100.0000 mg | ORAL_CAPSULE | Freq: Every day | ORAL | 3 refills | Status: DC

## 2015-11-28 NOTE — Telephone Encounter (Signed)
Spoke with patient. Advised of message as seen below from [place]. Patient is agreeable. Rx for Vivelle Dot 0.05 mg apply topically 2 per week #24 3RF and Prometrium 100 mg po qhs #90 3RF sent to pharmacy on file. Patient is agreeable.  Routing to provider for final review. Patient agreeable to disposition. Will close encounter.

## 2016-01-14 ENCOUNTER — Telehealth: Payer: Self-pay | Admitting: *Deleted

## 2016-01-14 DIAGNOSIS — M8580 Other specified disorders of bone density and structure, unspecified site: Secondary | ICD-10-CM

## 2016-01-14 NOTE — Telephone Encounter (Signed)
Spoke with patient about scheduling DEXA. [facility] and [facility]. Order placed -eh

## 2016-01-14 NOTE — Telephone Encounter (Signed)
-----   Message from Salvadore Dom, MD sent at 01/04/2016  2:45 PM EST ----- Please let the patient know that she should get a DEXA after her birthday in [birth year] and place an order for a screening dexa.

## 2016-01-30 ENCOUNTER — Other Ambulatory Visit: Payer: Self-pay | Admitting: Urology

## 2016-01-30 DIAGNOSIS — Q6239 Other obstructive defects of renal pelvis and ureter: Secondary | ICD-10-CM

## 2016-01-30 DIAGNOSIS — Q6211 Congenital occlusion of ureteropelvic junction: Principal | ICD-10-CM

## 2016-02-22 ENCOUNTER — Encounter (HOSPITAL_COMMUNITY)
Admission: RE | Admit: 2016-02-22 | Discharge: 2016-02-22 | Disposition: A | Discharge status: Home / Self Care | Payer: PPO | Source: Ambulatory Visit | Attending: Urology | Admitting: Urology

## 2016-02-22 DIAGNOSIS — Q6239 Other obstructive defects of renal pelvis and ureter: Secondary | ICD-10-CM

## 2016-02-22 DIAGNOSIS — Q6211 Congenital occlusion of ureteropelvic junction: Secondary | ICD-10-CM | POA: Insufficient documentation

## 2016-02-22 MED ORDER — FUROSEMIDE 10 MG/ML IJ SOLN
31.0000 mg | Freq: Once | INTRAMUSCULAR | Status: AC
  Administered 2016-02-22: 31 mg via INTRAVENOUS

## 2016-02-22 MED ORDER — FUROSEMIDE 10 MG/ML IJ SOLN
INTRAMUSCULAR | Status: AC
  Filled 2016-02-22: qty 4

## 2016-02-22 MED ORDER — TECHNETIUM TC 99M MERTIATIDE
5.3000 | Freq: Once | INTRAVENOUS | Status: AC | PRN
  Administered 2016-02-22: 5.3 via INTRAVENOUS

## 2016-03-24 ENCOUNTER — Other Ambulatory Visit: Payer: Self-pay | Admitting: Internal Medicine

## 2016-03-24 DIAGNOSIS — Z1231 Encounter for screening mammogram for malignant neoplasm of breast: Secondary | ICD-10-CM

## 2016-05-05 ENCOUNTER — Other Ambulatory Visit: Payer: BLUE CROSS/BLUE SHIELD

## 2016-05-05 ENCOUNTER — Ambulatory Visit: Payer: BLUE CROSS/BLUE SHIELD

## 2016-05-22 ENCOUNTER — Ambulatory Visit
Admission: RE | Admit: 2016-05-22 | Discharge: 2016-05-22 | Disposition: A | Discharge status: Home / Self Care | Payer: PPO | Source: Ambulatory Visit | Attending: Internal Medicine | Admitting: Internal Medicine

## 2016-05-22 ENCOUNTER — Ambulatory Visit
Admission: RE | Admit: 2016-05-22 | Discharge: 2016-05-22 | Disposition: A | Discharge status: Home / Self Care | Payer: PPO | Source: Ambulatory Visit | Attending: Obstetrics and Gynecology | Admitting: Obstetrics and Gynecology

## 2016-05-22 DIAGNOSIS — M8580 Other specified disorders of bone density and structure, unspecified site: Secondary | ICD-10-CM

## 2016-05-22 DIAGNOSIS — Z1231 Encounter for screening mammogram for malignant neoplasm of breast: Secondary | ICD-10-CM

## 2016-05-22 DIAGNOSIS — Z780 Asymptomatic menopausal state: Secondary | ICD-10-CM | POA: Diagnosis not present

## 2016-05-22 DIAGNOSIS — M85851 Other specified disorders of bone density and structure, right thigh: Secondary | ICD-10-CM | POA: Diagnosis not present

## 2016-05-23 ENCOUNTER — Other Ambulatory Visit: Payer: Self-pay | Admitting: Internal Medicine

## 2016-05-23 DIAGNOSIS — R928 Other abnormal and inconclusive findings on diagnostic imaging of breast: Secondary | ICD-10-CM

## 2016-05-28 ENCOUNTER — Other Ambulatory Visit: Payer: PPO

## 2016-06-03 ENCOUNTER — Ambulatory Visit
Admission: RE | Admit: 2016-06-03 | Discharge: 2016-06-03 | Disposition: A | Discharge status: Home / Self Care | Payer: PPO | Source: Ambulatory Visit | Attending: Internal Medicine | Admitting: Internal Medicine

## 2016-06-03 DIAGNOSIS — R928 Other abnormal and inconclusive findings on diagnostic imaging of breast: Secondary | ICD-10-CM

## 2016-06-03 DIAGNOSIS — N6489 Other specified disorders of breast: Secondary | ICD-10-CM | POA: Diagnosis not present

## 2016-10-06 DIAGNOSIS — G4733 Obstructive sleep apnea (adult) (pediatric): Secondary | ICD-10-CM | POA: Diagnosis not present

## 2016-10-06 DIAGNOSIS — J209 Acute bronchitis, unspecified: Secondary | ICD-10-CM | POA: Diagnosis not present

## 2016-10-06 DIAGNOSIS — J309 Allergic rhinitis, unspecified: Secondary | ICD-10-CM | POA: Diagnosis not present

## 2016-10-16 ENCOUNTER — Encounter: Payer: Self-pay | Admitting: Obstetrics and Gynecology

## 2016-10-16 ENCOUNTER — Ambulatory Visit (INDEPENDENT_AMBULATORY_CARE_PROVIDER_SITE_OTHER): Payer: PPO | Admitting: Obstetrics and Gynecology

## 2016-10-16 VITALS — BP 110/60 | HR 76 | Resp 16 | Ht 59.5 in | Wt 144.0 lb

## 2016-10-16 DIAGNOSIS — Z124 Encounter for screening for malignant neoplasm of cervix: Secondary | ICD-10-CM

## 2016-10-16 DIAGNOSIS — Z79890 Hormone replacement therapy (postmenopausal): Secondary | ICD-10-CM

## 2016-10-16 DIAGNOSIS — Z01419 Encounter for gynecological examination (general) (routine) without abnormal findings: Secondary | ICD-10-CM | POA: Diagnosis not present

## 2016-10-16 MED ORDER — PROGESTERONE MICRONIZED 100 MG PO CAPS: 100.0000 mg | ORAL_CAPSULE | Freq: Every day | ORAL | 3 refills | Status: DC

## 2016-10-16 MED ORDER — ESTRADIOL 0.05 MG/24HR TD PTTW: 1.0000 | MEDICATED_PATCH | TRANSDERMAL | 3 refills | Status: DC

## 2016-10-16 NOTE — Patient Instructions (Signed)

## 2016-10-16 NOTE — Progress Notes (Signed)
66 y.o. [id number] Legally SeparatedCaucasianF here for annual exam. She is on HRT.  She is dating the same man. No dyspareunia. She is happy on her HRT. She has an occasional hot flash. No vaginal bleeding.     Patient's last menstrual period was 09/21/2001.          Sexually active: Yes.    The current method of family planning is post menopausal status.    Exercising: Yes.    walking Smoker:  no  Health Maintenance: Pap:  09-21-13 WNL NEG HR HPV  History of abnormal Pap:  no MMG:  06-03-16 Breast U/S WNL Colonoscopy:  01-21-08 WNL per patient  BMD:   05-22-16 WNL TDaP: unsure, thinks UTD with primary.  Gardasil: N/A   reports that she quit smoking about 33 years ago. She has never used smokeless tobacco. She reports that she drinks about 4.2 oz of alcohol per week . She reports that she does not use drugs.Son in [place] with 3 grandsons. Daughter in [place] with 61 mont old grandson.   Past Medical History:  Diagnosis Date  . Anemia   . Blood transfusion without reported diagnosis    5 Years ago due to internal bleeding  . Depression     Past Surgical History:  Procedure Laterality Date  . BREAST ENHANCEMENT SURGERY  3545   silicone inplants  . ESOPHAGOGASTRODUODENOSCOPY W/ BANDING  11/2008   duodenal divericular bleed-clipped    Current Outpatient Prescriptions  Medication Sig Dispense Refill  . Ascorbic Acid (VITAMIN C PO) Take by mouth daily.     . Cholecalciferol (VITAMIN D PO) Take by mouth daily.     Marland Kitchen escitalopram (LEXAPRO) 10 MG tablet TAKE ONE TABLET EACH DAY 90 tablet 3  . estradiol (VIVELLE-DOT) 0.05 MG/24HR patch Place 1 patch (0.05 mg total) onto the skin 2 (two) times a week. 24 patch 3  . Eszopiclone 3 MG TABS     . milk thistle 175 MG tablet Take 175 mg by mouth daily.    . Misc Natural Products (OSTEO BI-FLEX ADV DOUBLE ST PO) Take by mouth.    . Multiple Vitamins-Minerals (MULTIVITAMIN PO) Take by mouth.    . Omega-3 Fatty Acids (FISH OIL PO) Take by  mouth daily.    . Probiotic Product (PROBIOTIC DAILY PO) Take by mouth daily.     . progesterone (PROMETRIUM) 100 MG capsule Take 1 capsule (100 mg total) by mouth daily. 90 capsule 3   No current facility-administered medications for this visit.     Family History  Problem Relation Age of Onset  . Colon cancer Maternal Grandmother   . Breast cancer Sister        Half Sister    Review of Systems  Constitutional: Negative.   HENT: Negative.   Eyes: Negative.   Respiratory: Negative.   Cardiovascular: Negative.   Gastrointestinal: Negative.   Endocrine: Negative.   Genitourinary: Negative.   Musculoskeletal: Negative.   Skin: Negative.   Allergic/Immunologic: Negative.   Neurological: Negative.   Psychiatric/Behavioral: Negative.     Exam:   BP 110/60 (BP Location: Right Arm, Patient Position: Sitting, Cuff Size: Normal)   Pulse 76   Resp 16   Ht 4' 11.5" (1.511 m)   Wt 144 lb (65.3 kg)   LMP 09/21/2001   BMI 28.60 kg/m   Weight change: @WEIGHTCHANGE @ Height:   Height: 4' 11.5" (151.1 cm)  Ht Readings from Last 3 Encounters:  10/16/16 4' 11.5" (1.511 m)  10/10/15 4' 11.5" (  1.511 m)  06/20/15 5' (1.524 m)    General appearance: alert, cooperative and appears stated age Head: Normocephalic, without obvious abnormality, atraumatic Neck: no adenopathy, supple, symmetrical, trachea midline and thyroid normal to inspection and palpation Lungs: clear to auscultation bilaterally Cardiovascular: regular rate and rhythm Breasts: normal appearance, no masses or tenderness Abdomen: soft, non-tender; bowel sounds normal; no masses,  no organomegaly Extremities: extremities normal, atraumatic, no cyanosis or edema Skin: Skin color, texture, turgor normal. No rashes or lesions Lymph nodes: Cervical, supraclavicular, and axillary nodes normal. No abnormal inguinal nodes palpated Neurologic: Grossly normal   Pelvic: External genitalia:  no lesions              Urethra:   normal appearing urethra with no masses, tenderness or lesions              Bartholins and Skenes: normal                 Vagina: normal appearing vagina with normal color and discharge, no lesions              Cervix: no lesions               Bimanual Exam:  Uterus:  normal size, contour, position, consistency, mobility, non-tender              Adnexa: no mass, fullness, tenderness               Rectovaginal: Confirms               Anus:  normal sphincter tone, no lesions  Chaperone was present for exam.  A:  Well Woman with normal exam  On HRT, doing well, wants to continue. She is aware of the risks  Screening cervical cancer  P:   Pap with hpv  Mammogram and colonoscopy UTD  DEXA UTD and normal  Discussed breast self exam  Discussed calcium and vit D intake  Labs with primary  Continue HRT

## 2016-10-20 ENCOUNTER — Other Ambulatory Visit (HOSPITAL_COMMUNITY)
Admission: RE | Admit: 2016-10-20 | Discharge: 2016-10-20 | Disposition: A | Discharge status: Home / Self Care | Payer: PPO | Source: Ambulatory Visit | Attending: Obstetrics and Gynecology | Admitting: Obstetrics and Gynecology

## 2016-10-20 DIAGNOSIS — Z124 Encounter for screening for malignant neoplasm of cervix: Secondary | ICD-10-CM | POA: Insufficient documentation

## 2016-10-20 NOTE — Addendum Note (Signed)
Addended by: Dorothy Spark on: 10/20/2016 10:09 AM   Modules accepted: Orders

## 2016-10-21 LAB — CYTOLOGY - PAP
Diagnosis: NEGATIVE
HPV (WINDOPATH): NOT DETECTED

## 2016-11-05 DIAGNOSIS — Z6841 Body Mass Index (BMI) 40.0 and over, adult: Secondary | ICD-10-CM | POA: Diagnosis not present

## 2016-11-05 DIAGNOSIS — R05 Cough: Secondary | ICD-10-CM | POA: Diagnosis not present

## 2016-11-05 DIAGNOSIS — J209 Acute bronchitis, unspecified: Secondary | ICD-10-CM | POA: Diagnosis not present

## 2016-11-08 ENCOUNTER — Other Ambulatory Visit: Payer: Self-pay | Admitting: Obstetrics and Gynecology

## 2016-11-10 NOTE — Telephone Encounter (Signed)
Medication refill request: Lexapro  Last AEX:  10-16-16  Next AEX: 10-22-17  Last MMG (if hormonal medication request): 06-03-16 right breast U/S WNL  Refill authorized: please advise

## 2016-11-18 DIAGNOSIS — E7800 Pure hypercholesterolemia, unspecified: Secondary | ICD-10-CM | POA: Diagnosis not present

## 2016-11-18 DIAGNOSIS — R946 Abnormal results of thyroid function studies: Secondary | ICD-10-CM | POA: Diagnosis not present

## 2016-11-18 DIAGNOSIS — Z0000 Encounter for general adult medical examination without abnormal findings: Secondary | ICD-10-CM | POA: Diagnosis not present

## 2016-11-24 DIAGNOSIS — Z87440 Personal history of urinary (tract) infections: Secondary | ICD-10-CM | POA: Diagnosis not present

## 2016-11-24 DIAGNOSIS — J189 Pneumonia, unspecified organism: Secondary | ICD-10-CM | POA: Diagnosis not present

## 2016-11-24 DIAGNOSIS — N898 Other specified noninflammatory disorders of vagina: Secondary | ICD-10-CM | POA: Diagnosis not present

## 2016-11-24 DIAGNOSIS — Z1389 Encounter for screening for other disorder: Secondary | ICD-10-CM | POA: Diagnosis not present

## 2016-11-24 DIAGNOSIS — G4733 Obstructive sleep apnea (adult) (pediatric): Secondary | ICD-10-CM | POA: Diagnosis not present

## 2016-11-24 DIAGNOSIS — J3089 Other allergic rhinitis: Secondary | ICD-10-CM | POA: Diagnosis not present

## 2016-11-24 DIAGNOSIS — E663 Overweight: Secondary | ICD-10-CM | POA: Diagnosis not present

## 2016-11-24 DIAGNOSIS — Z23 Encounter for immunization: Secondary | ICD-10-CM | POA: Diagnosis not present

## 2016-11-24 DIAGNOSIS — Z6828 Body mass index (BMI) 28.0-28.9, adult: Secondary | ICD-10-CM | POA: Diagnosis not present

## 2016-11-24 DIAGNOSIS — Z0000 Encounter for general adult medical examination without abnormal findings: Secondary | ICD-10-CM | POA: Diagnosis not present

## 2016-11-24 DIAGNOSIS — I77810 Thoracic aortic ectasia: Secondary | ICD-10-CM | POA: Diagnosis not present

## 2016-11-24 DIAGNOSIS — R946 Abnormal results of thyroid function studies: Secondary | ICD-10-CM | POA: Diagnosis not present

## 2016-12-16 DIAGNOSIS — H52223 Regular astigmatism, bilateral: Secondary | ICD-10-CM | POA: Diagnosis not present

## 2016-12-16 DIAGNOSIS — H2513 Age-related nuclear cataract, bilateral: Secondary | ICD-10-CM | POA: Diagnosis not present

## 2016-12-16 DIAGNOSIS — H5203 Hypermetropia, bilateral: Secondary | ICD-10-CM | POA: Diagnosis not present

## 2017-01-07 DIAGNOSIS — J029 Acute pharyngitis, unspecified: Secondary | ICD-10-CM | POA: Diagnosis not present

## 2017-01-07 DIAGNOSIS — H6690 Otitis media, unspecified, unspecified ear: Secondary | ICD-10-CM | POA: Diagnosis not present

## 2017-01-07 DIAGNOSIS — Z6828 Body mass index (BMI) 28.0-28.9, adult: Secondary | ICD-10-CM | POA: Diagnosis not present

## 2017-01-07 DIAGNOSIS — J309 Allergic rhinitis, unspecified: Secondary | ICD-10-CM | POA: Diagnosis not present

## 2017-02-23 DIAGNOSIS — R05 Cough: Secondary | ICD-10-CM | POA: Diagnosis not present

## 2017-02-23 DIAGNOSIS — J069 Acute upper respiratory infection, unspecified: Secondary | ICD-10-CM | POA: Diagnosis not present

## 2017-03-16 ENCOUNTER — Other Ambulatory Visit: Payer: Self-pay | Admitting: Internal Medicine

## 2017-03-16 DIAGNOSIS — R05 Cough: Secondary | ICD-10-CM

## 2017-03-16 DIAGNOSIS — R059 Cough, unspecified: Secondary | ICD-10-CM

## 2017-03-26 ENCOUNTER — Ambulatory Visit
Admission: RE | Admit: 2017-03-26 | Discharge: 2017-03-26 | Disposition: A | Discharge status: Home / Self Care | Payer: PPO | Source: Ambulatory Visit | Attending: Internal Medicine | Admitting: Internal Medicine

## 2017-03-26 DIAGNOSIS — J189 Pneumonia, unspecified organism: Secondary | ICD-10-CM | POA: Diagnosis not present

## 2017-03-26 DIAGNOSIS — R059 Cough, unspecified: Secondary | ICD-10-CM

## 2017-03-26 DIAGNOSIS — R05 Cough: Secondary | ICD-10-CM

## 2017-03-26 MED ORDER — IOPAMIDOL (ISOVUE-300) INJECTION 61%
75.0000 mL | Freq: Once | INTRAVENOUS | Status: AC | PRN
  Administered 2017-03-26: 75 mL via INTRAVENOUS

## 2017-04-02 DIAGNOSIS — Z6829 Body mass index (BMI) 29.0-29.9, adult: Secondary | ICD-10-CM | POA: Diagnosis not present

## 2017-04-02 DIAGNOSIS — J40 Bronchitis, not specified as acute or chronic: Secondary | ICD-10-CM | POA: Diagnosis not present

## 2017-04-02 DIAGNOSIS — R05 Cough: Secondary | ICD-10-CM | POA: Diagnosis not present

## 2017-06-18 ENCOUNTER — Other Ambulatory Visit: Payer: Self-pay | Admitting: Internal Medicine

## 2017-06-18 DIAGNOSIS — Z1231 Encounter for screening mammogram for malignant neoplasm of breast: Secondary | ICD-10-CM

## 2017-07-13 ENCOUNTER — Ambulatory Visit
Admission: RE | Admit: 2017-07-13 | Discharge: 2017-07-13 | Disposition: A | Discharge status: Home / Self Care | Payer: PPO | Source: Ambulatory Visit | Attending: Internal Medicine | Admitting: Internal Medicine

## 2017-07-13 DIAGNOSIS — Z1231 Encounter for screening mammogram for malignant neoplasm of breast: Secondary | ICD-10-CM | POA: Diagnosis not present

## 2017-10-22 ENCOUNTER — Ambulatory Visit: Payer: PPO | Admitting: Obstetrics and Gynecology

## 2017-10-23 ENCOUNTER — Other Ambulatory Visit: Payer: Self-pay | Admitting: Obstetrics and Gynecology

## 2017-10-23 ENCOUNTER — Other Ambulatory Visit: Payer: Self-pay

## 2017-10-23 NOTE — Telephone Encounter (Signed)
Patient calling requesting a refill for her estrogen patch. Patient scheduled annual for 10/30/17.

## 2017-10-23 NOTE — Telephone Encounter (Signed)
Medication refill request: vivelle dot 0.05 mg  Last AEX:  10/16/16 Next AEX: 10/30/17 Last MMG (if hormonal medication request): 07/13/17  Bi-rads category 1 neg  Refill authorized: Please refill until AEX if appropriate

## 2017-10-25 MED ORDER — ESTRADIOL 0.05 MG/24HR TD PTTW: 1.0000 | MEDICATED_PATCH | TRANSDERMAL | 0 refills | Status: DC

## 2017-10-26 MED ORDER — ESTRADIOL 0.05 MG/24HR TD PTTW: 1.0000 | MEDICATED_PATCH | TRANSDERMAL | 0 refills | Status: DC

## 2017-10-26 NOTE — Telephone Encounter (Signed)
Medication refill request: Estrogen patch Last AEX:  10/16/2016 Next AEX: 10/2017 Last MMG (if hormonal medication request): 07/2017 BI-RADS CATEGORY  1:  Negative. Refill authorized: #24, 3 refills

## 2017-10-30 ENCOUNTER — Ambulatory Visit: Payer: PPO | Admitting: Obstetrics and Gynecology

## 2017-11-09 ENCOUNTER — Other Ambulatory Visit: Payer: Self-pay | Admitting: Obstetrics and Gynecology

## 2017-11-09 NOTE — Telephone Encounter (Signed)
Medication refill request: lexapro 10mg  Last AEX:  10/16/16 Next AEX:  11/17/17 Last MMG (if hormonal medication request): 07/13/17 Bi-rads 1 neg  Refill authorized: #30 with 0 refills please refill if appropriate

## 2017-11-12 ENCOUNTER — Ambulatory Visit: Payer: PPO | Admitting: Obstetrics and Gynecology

## 2017-11-16 NOTE — Progress Notes (Signed)
67 y.o. [id number] Legally Separated White or Caucasian Not Hispanic or Latino female here for annual exam.  No vaginal bleeding. She is on HRT doing well, no vasomotor symptoms, willing to try the next lower dose. Sexually active, same partner x 3 years, living together. No dyspareunia. No bowel or bladder issues.  Almost divorced.     Patient's last menstrual period was 09/21/2001.          Sexually active: Yes.    The current method of family planning is post menopausal status.    Exercising: Yes.    walking Smoker:  no  Health Maintenance: Pap:  10/20/2016 WNL with neg HR HPV, 09-21-13 WNL NEG HR HPV  History of abnormal Pap:  no MMG:  07/13/2017 Birads 1 negative Colonoscopy:  01-21-08 WNL per patient  BMD:   05-22-16 WNL TDaP: unsure, thinks UTD with primary.  Gardasil: N/A   reports that she quit smoking about 34 years ago. She has never used smokeless tobacco. She reports that she drinks about 6.0 standard drinks of alcohol per week. She reports that she does not use drugs. Started working 4 years ago when she separated from her husband. 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 Daughter in [place] with 3 grandsons. Daughter in [place] with other grandson. Her daughters are very close.   Past Medical History:  Diagnosis Date  . Anemia   . Blood transfusion without reported diagnosis    5 Years ago due to internal bleeding  . Depression     Past Surgical History:  Procedure Laterality Date  . AUGMENTATION MAMMAPLASTY    . BREAST ENHANCEMENT SURGERY  6440   silicone inplants  . ESOPHAGOGASTRODUODENOSCOPY W/ BANDING  11/2008   duodenal divericular bleed-clipped    Current Outpatient Medications  Medication Sig Dispense Refill  . Ascorbic Acid (VITAMIN C PO) Take by mouth daily.     . Cholecalciferol (VITAMIN D PO) Take by mouth daily.     Marland Kitchen escitalopram (LEXAPRO) 10 MG tablet TAKE ONE TABLET DAILY 30 tablet 0  . estradiol (VIVELLE-DOT) 0.05  MG/24HR patch Place 1 patch (0.05 mg total) onto the skin 2 (two) times a week. 24 patch 0  . Eszopiclone 3 MG TABS     . milk thistle 175 MG tablet Take 175 mg by mouth daily.    . Misc Natural Products (OSTEO BI-FLEX ADV DOUBLE ST PO) Take by mouth.    . Multiple Vitamins-Minerals (MULTIVITAMIN PO) Take by mouth.    . Omega-3 Fatty Acids (FISH OIL PO) Take by mouth daily.    . Probiotic Product (PROBIOTIC DAILY PO) Take by mouth daily.     . progesterone (PROMETRIUM) 100 MG capsule Take 1 capsule (100 mg total) by mouth daily. 90 capsule 3   No current facility-administered medications for this visit.     Family History  Problem Relation Age of Onset  . Colon cancer Maternal Grandmother   . Breast cancer Sister        Half Sister    Review of Systems  Constitutional: Negative.   HENT: Negative.   Respiratory: Negative.   Cardiovascular: Negative.   Gastrointestinal: Negative.   Endocrine: Negative.   Genitourinary: Negative.   Musculoskeletal: Negative.   Skin: Negative.   Allergic/Immunologic: Negative.   Neurological: Negative.   Hematological: Negative.   Psychiatric/Behavioral: Negative.     Exam:   BP 110/62 (BP Location: Right Arm, Patient Position: Sitting, Cuff Size: Normal)  Pulse 76   Ht 4' 11.5" (1.511 m)   Wt 150 lb (68 kg)   LMP 09/21/2001   BMI 29.79 kg/m   Weight change: @WEIGHTCHANGE @ Height:   Height: 4' 11.5" (151.1 cm)  Ht Readings from Last 3 Encounters:  11/17/17 4' 11.5" (1.511 m)  10/16/16 4' 11.5" (1.511 m)  10/10/15 4' 11.5" (1.511 m)    General appearance: alert, cooperative and appears stated age Head: Normocephalic, without obvious abnormality, atraumatic Neck: no adenopathy, supple, symmetrical, trachea midline and thyroid normal to inspection and palpation Lungs: clear to auscultation bilaterally Cardiovascular: regular rate and rhythm Breasts: normal appearance, no masses or tenderness Abdomen: soft, non-tender; non distended,   no masses,  no organomegaly Extremities: extremities normal, atraumatic, no cyanosis or edema Skin: Skin color, texture, turgor normal. No rashes or lesions Lymph nodes: Cervical, supraclavicular, and axillary nodes normal. No abnormal inguinal nodes palpated Neurologic: Grossly normal   Pelvic: External genitalia:  no lesions              Urethra:  normal appearing urethra with no masses, tenderness or lesions              Bartholins and Skenes: normal                 Vagina: normal appearing vagina with normal color and discharge, no lesions              Cervix: no lesions               Bimanual Exam:  Uterus:  normal size, contour, position, consistency, mobility, non-tender              Adnexa: no mass, fullness, tenderness               Rectovaginal: Confirms               Anus:  normal sphincter tone, no lesions  Chaperone was present for exam.  A:  Well Woman with normal exam  On HRT, aware of risks, willing to go down on the dose of estrogen    P:   Decrease to estradiol 0.0375 mg patch  Prometrium 100 mg qhs  Mammogram UTD  Colonoscopy referral placed  Discussed breast self exam  Discussed calcium and vit D intake  DEXA UTD

## 2017-11-17 ENCOUNTER — Ambulatory Visit (INDEPENDENT_AMBULATORY_CARE_PROVIDER_SITE_OTHER): Payer: PPO | Admitting: Obstetrics and Gynecology

## 2017-11-17 ENCOUNTER — Other Ambulatory Visit: Payer: Self-pay

## 2017-11-17 ENCOUNTER — Encounter: Payer: Self-pay | Admitting: Obstetrics and Gynecology

## 2017-11-17 VITALS — BP 110/62 | HR 76 | Ht 59.5 in | Wt 150.0 lb

## 2017-11-17 DIAGNOSIS — Z1211 Encounter for screening for malignant neoplasm of colon: Secondary | ICD-10-CM

## 2017-11-17 DIAGNOSIS — Z79890 Hormone replacement therapy (postmenopausal): Secondary | ICD-10-CM | POA: Diagnosis not present

## 2017-11-17 DIAGNOSIS — Z01419 Encounter for gynecological examination (general) (routine) without abnormal findings: Secondary | ICD-10-CM | POA: Diagnosis not present

## 2017-11-17 MED ORDER — ESTRADIOL 0.0375 MG/24HR TD PTTW: 1.0000 | MEDICATED_PATCH | TRANSDERMAL | 3 refills | Status: DC

## 2017-11-17 MED ORDER — PROGESTERONE MICRONIZED 100 MG PO CAPS: 100.0000 mg | ORAL_CAPSULE | Freq: Every day | ORAL | 3 refills | Status: DC

## 2017-11-17 NOTE — Patient Instructions (Addendum)
EXERCISE AND DIET:  We recommended that you start or continue a regular exercise program for good health. Regular exercise means any activity that makes your heart beat faster and makes you sweat.  We recommend exercising at least 30 minutes per day at least 3 days a week, preferably 4 or 5.  We also recommend a diet low in fat and sugar.  Inactivity, poor dietary choices and obesity can cause diabetes, heart attack, stroke, and kidney damage, among others.    ALCOHOL AND SMOKING:  Women should limit their alcohol intake to no more than 7 drinks/beers/glasses of wine (combined, not each!) per week. Moderation of alcohol intake to this level decreases your risk of breast cancer and liver damage. And of course, no recreational drugs are part of a healthy lifestyle.  And absolutely no smoking or even second hand smoke. Most people know smoking can cause heart and lung diseases, but did you know it also contributes to weakening of your bones? Aging of your skin?  Yellowing of your teeth and nails?  CALCIUM AND VITAMIN D:  Adequate intake of calcium and Vitamin D are recommended.  The recommendations for exact amounts of these supplements seem to change often, but generally speaking 600 mg of calcium (either carbonate or citrate) and 800 units of Vitamin D per day seems prudent. Certain women may benefit from higher intake of Vitamin D.  If you are among these women, your doctor will have told you during your visit.    PAP SMEARS:  Pap smears, to check for cervical cancer or precancers,  have traditionally been done yearly, although recent scientific advances have shown that most women can have pap smears less often.  However, every woman still should have a physical exam from her gynecologist every year. It will include a breast check, inspection of the vulva and vagina to check for abnormal growths or skin changes, a visual exam of the cervix, and then an exam to evaluate the size and shape of the uterus and  ovaries.  And after 66 years of age, a rectal exam is indicated to check for rectal cancers. We will also provide age appropriate advice regarding health maintenance, like when you should have certain vaccines, screening for sexually transmitted diseases, bone density testing, colonoscopy, mammograms, etc.   MAMMOGRAMS:  All women over 40 years old should have a yearly mammogram. Many facilities now offer a "3D" mammogram, which may cost around $50 extra out of pocket. If possible,  we recommend you accept the option to have the 3D mammogram performed.  It both reduces the number of women who will be called back for extra views which then turn out to be normal, and it is better than the routine mammogram at detecting truly abnormal areas.    COLONOSCOPY:  Colonoscopy to screen for colon cancer is recommended for all women at age 50.  We know, you hate the idea of the prep.  We agree, BUT, having colon cancer and not knowing it is worse!!  Colon cancer so often starts as a polyp that can be seen and removed at colonscopy, which can quite literally save your life!  And if your first colonoscopy is normal and you have no family history of colon cancer, most women don't have to have it again for 10 years.  Once every ten years, you can do something that may end up saving your life, right?  We will be happy to help you get it scheduled when you are ready.    Be sure to check your insurance coverage so you understand how much it will cost.  It may be covered as a preventative service at no cost, but you should check your particular policy.      Menopause and Hormone Replacement Therapy What is hormone replacement therapy? Hormone replacement therapy (HRT) is the use of artificial (synthetic) hormones to replace hormones that your body stops producing during menopause. Menopause is the normal time of life when menstrual periods stop completely and the ovaries stop producing the female hormones estrogen and  progesterone. This lack of hormones can affect your health and cause undesirable symptoms. HRT can relieve some of those symptoms. What are my options for HRT? HRT may consist of the synthetic hormones estrogen and progestin, or it may consist of only estrogen (estrogen-only therapy). You and your health care provider will decide which form of HRT is best for you. If you choose to be on HRT and you have a uterus, estrogen and progestin are usually prescribed. Estrogen-only therapy is used for women who do not have a uterus. Possible options for taking HRT include:  Pills.  Patches.  Gels.  Sprays.  Vaginal cream.  Vaginal rings.  Vaginal inserts.  The amount of hormone(s) that you take and how long you take the hormone(s) varies depending on your individual health. It is important to:  Begin HRT with the lowest possible dosage.  Stop HRT as soon as your health care provider tells you to stop.  Work with your health care provider so that you feel informed and comfortable with your decisions.  What are the benefits of HRT? HRT can reduce the frequency and severity of menopausal symptoms. Benefits of HRT vary depending on the menopausal symptoms that you have, the severity of your symptoms, and your overall health. HRT may help to improve the following menopausal symptoms:  Hot flashes and night sweats. These are sudden feelings of heat that spread over the face and body. The skin may turn red, like a blush. Night sweats are hot flashes that happen while you are sleeping or trying to sleep.  Bone loss (osteoporosis). The body loses calcium more quickly after menopause, causing the bones to become weaker. This can increase the risk for bone breaks (fractures).  Vaginal dryness. The lining of the vagina can become thin and dry, which can cause pain during sexual intercourse or cause infection, burning, or itching.  Urinary tract infections.  Urinary incontinence. This is a  decreased ability to control when you urinate.  Irritability.  Short-term memory problems.  What are the risks of HRT? Risks of HRT vary depending on your individual health and medical history. Risks of HRT also depend on whether you receive both estrogen and progestin or you receive estrogen only.HRT may increase the risk of:  Spotting. This is when a small amount of bloodleaks from the vagina unexpectedly.  Endometrial cancer. This cancer is in the lining of the uterus (endometrium).  Breast cancer.  Increased density of breast tissue. This can make it harder to find breast cancer on a breast X-ray (mammogram).  Stroke.  Heart attack.  Blood clots.  Gallbladder disease.  Risks of HRT can increase if you have any of the following conditions:  Endometrial cancer.  Liver disease.  Heart disease.  Breast cancer.  History of blood clots.  History of stroke.  How should I care for myself while I am on HRT?  Take over-the-counter and prescription medicines only as told by your health care  provider.  Get mammograms, pelvic exams, and medical checkups as often as told by your health care provider.  Have Pap tests done as often as told by your health care provider. A Pap test is sometimes called a Pap smear. It is a screening test that is used to check for signs of cancer of the cervix and vagina. A Pap test can also identify the presence of infection or precancerous changes. Pap tests may be done: ? Every 3 years, starting at age 56. ? Every 5 years, starting after age 104, in combination with testing for human papillomavirus (HPV). ? More often or less often depending on other medical conditions you have, your age, and other risk factors.  It is your responsibility to get your Pap test results. Ask your health care provider or the department performing the test when your results will be ready.  Keep all follow-up visits as told by your health care provider. This is  important. When should I seek medical care? Talk with your health care provider if:  You have any of these: ? Pain or swelling in your legs. ? Shortness of breath. ? Chest pain. ? Lumps or changes in your breasts or armpits. ? Slurred speech. ? Pain, burning, or bleeding when you urine.  You develop any of these: ? Unusual vaginal bleeding. ? Dizziness or headaches. ? Weakness or numbness in any part of your arms or legs. ? Pain in your abdomen.  This information is not intended to replace advice given to you by your health care provider. Make sure you discuss any questions you have with your health care provider. Document Released: 10/26/2002 Document Revised: 12/25/2015 Document Reviewed: 07/31/2014 Elsevier Interactive Patient Education  2017 [place] Breast self-awareness means being familiar with how your breasts look and feel. It involves checking your breasts regularly and reporting any changes to your health care provider. Practicing breast self-awareness is important. A change in your breasts can be a sign of a serious medical problem. Being familiar with how your breasts look and feel allows you to find any problems early, when treatment is more likely to be successful. All women should practice breast self-awareness, including women who have had breast implants. How to do a breast self-exam One way to learn what is normal for your breasts and whether your breasts are changing is to do a breast self-exam. To do a breast self-exam: Look for Changes  1. Remove all the clothing above your waist. 2. Stand in front of a mirror in a room with good lighting. 3. Put your hands on your hips. 4. Push your hands firmly downward. 5. Compare your breasts in the mirror. Look for differences between them (asymmetry), such as: ? Differences in shape. ? Differences in size. ? Puckers, dips, and bumps in one breast and not the other. 6. Look at each breast  for changes in your skin, such as: ? Redness. ? Scaly areas. 7. Look for changes in your nipples, such as: ? Discharge. ? Bleeding. ? Dimpling. ? Redness. ? A change in position. Feel for Changes  Carefully feel your breasts for lumps and changes. It is best to do this while lying on your back on the floor and again while sitting or standing in the shower or tub with soapy water on your skin. Feel each breast in the following way:  Place the arm on the side of the breast you are examining above your head.  Feel your breast with the  other hand.  Start in the nipple area and make  inch (2 cm) overlapping circles to feel your breast. Use the pads of your three middle fingers to do this. Apply light pressure, then medium pressure, then firm pressure. The light pressure will allow you to feel the tissue closest to the skin. The medium pressure will allow you to feel the tissue that is a little deeper. The firm pressure will allow you to feel the tissue close to the ribs.  Continue the overlapping circles, moving downward over the breast until you feel your ribs below your breast.  Move one finger-width toward the center of the body. Continue to use the  inch (2 cm) overlapping circles to feel your breast as you move slowly up toward your collarbone.  Continue the up and down exam using all three pressures until you reach your armpit.  Write Down What You Find  Write down what is normal for each breast and any changes that you find. Keep a written record with breast changes or normal findings for each breast. By writing this information down, you do not need to depend only on memory for size, tenderness, or location. Write down where you are in your menstrual cycle, if you are still menstruating. If you are having trouble noticing differences in your breasts, do not get discouraged. With time you will become more familiar with the variations in your breasts and more comfortable with the  exam. How often should I examine my breasts? Examine your breasts every month. If you are breastfeeding, the best time to examine your breasts is after a feeding or after using a breast pump. If you menstruate, the best time to examine your breasts is 5-7 days after your period is over. During your period, your breasts are lumpier, and it may be more difficult to notice changes. When should I see my health care provider? See your health care provider if you notice:  A change in shape or size of your breasts or nipples.  A change in the skin of your breast or nipples, such as a reddened or scaly area.  Unusual discharge from your nipples.  A lump or thick area that was not there before.  Pain in your breasts.  Anything that concerns you.  This information is not intended to replace advice given to you by your health care provider. Make sure you discuss any questions you have with your health care provider. Document Released: 01/27/2005 Document Revised: 07/05/2015 Document Reviewed: 12/17/2014 Elsevier Interactive Patient Education  [employer].

## 2017-11-19 DIAGNOSIS — R82998 Other abnormal findings in urine: Secondary | ICD-10-CM | POA: Diagnosis not present

## 2017-11-19 DIAGNOSIS — E7849 Other hyperlipidemia: Secondary | ICD-10-CM | POA: Diagnosis not present

## 2017-11-19 DIAGNOSIS — R946 Abnormal results of thyroid function studies: Secondary | ICD-10-CM | POA: Diagnosis not present

## 2017-11-20 DIAGNOSIS — D1801 Hemangioma of skin and subcutaneous tissue: Secondary | ICD-10-CM | POA: Diagnosis not present

## 2017-11-20 DIAGNOSIS — L821 Other seborrheic keratosis: Secondary | ICD-10-CM | POA: Diagnosis not present

## 2017-11-20 DIAGNOSIS — D225 Melanocytic nevi of trunk: Secondary | ICD-10-CM | POA: Diagnosis not present

## 2017-11-26 DIAGNOSIS — E663 Overweight: Secondary | ICD-10-CM | POA: Diagnosis not present

## 2017-11-26 DIAGNOSIS — Z6828 Body mass index (BMI) 28.0-28.9, adult: Secondary | ICD-10-CM | POA: Diagnosis not present

## 2017-11-26 DIAGNOSIS — Z87440 Personal history of urinary (tract) infections: Secondary | ICD-10-CM | POA: Diagnosis not present

## 2017-11-26 DIAGNOSIS — G4733 Obstructive sleep apnea (adult) (pediatric): Secondary | ICD-10-CM | POA: Diagnosis not present

## 2017-11-26 DIAGNOSIS — Z1389 Encounter for screening for other disorder: Secondary | ICD-10-CM | POA: Diagnosis not present

## 2017-11-26 DIAGNOSIS — R030 Elevated blood-pressure reading, without diagnosis of hypertension: Secondary | ICD-10-CM | POA: Diagnosis not present

## 2017-11-26 DIAGNOSIS — F430 Acute stress reaction: Secondary | ICD-10-CM | POA: Diagnosis not present

## 2017-11-26 DIAGNOSIS — Z23 Encounter for immunization: Secondary | ICD-10-CM | POA: Diagnosis not present

## 2017-11-26 DIAGNOSIS — Z0000 Encounter for general adult medical examination without abnormal findings: Secondary | ICD-10-CM | POA: Diagnosis not present

## 2017-11-26 DIAGNOSIS — R197 Diarrhea, unspecified: Secondary | ICD-10-CM | POA: Diagnosis not present

## 2017-11-26 DIAGNOSIS — R946 Abnormal results of thyroid function studies: Secondary | ICD-10-CM | POA: Diagnosis not present

## 2017-11-26 DIAGNOSIS — N898 Other specified noninflammatory disorders of vagina: Secondary | ICD-10-CM | POA: Diagnosis not present

## 2017-11-26 DIAGNOSIS — I77810 Thoracic aortic ectasia: Secondary | ICD-10-CM | POA: Diagnosis not present

## 2017-11-30 DIAGNOSIS — Z1212 Encounter for screening for malignant neoplasm of rectum: Secondary | ICD-10-CM | POA: Diagnosis not present

## 2017-12-10 ENCOUNTER — Other Ambulatory Visit: Payer: Self-pay | Admitting: Obstetrics and Gynecology

## 2017-12-10 NOTE — Telephone Encounter (Signed)
Medication refill request: Lexapro  Last AEX:  11/17/17 Next AEX: 11/22/18 Last MMG (if hormonal medication request): 07/13/17 Bi-rads 1 neg  Refill authorized: #[id number] with 6RF

## 2017-12-21 ENCOUNTER — Encounter: Payer: Self-pay | Admitting: Internal Medicine

## 2017-12-25 DIAGNOSIS — R300 Dysuria: Secondary | ICD-10-CM | POA: Diagnosis not present

## 2018-03-10 DIAGNOSIS — H524 Presbyopia: Secondary | ICD-10-CM | POA: Diagnosis not present

## 2018-03-10 DIAGNOSIS — H5203 Hypermetropia, bilateral: Secondary | ICD-10-CM | POA: Diagnosis not present

## 2018-03-10 DIAGNOSIS — H52223 Regular astigmatism, bilateral: Secondary | ICD-10-CM | POA: Diagnosis not present

## 2018-03-26 DIAGNOSIS — B009 Herpesviral infection, unspecified: Secondary | ICD-10-CM | POA: Diagnosis not present

## 2018-07-14 DIAGNOSIS — Z1159 Encounter for screening for other viral diseases: Secondary | ICD-10-CM | POA: Diagnosis not present

## 2018-08-02 ENCOUNTER — Other Ambulatory Visit: Payer: Self-pay | Admitting: Internal Medicine

## 2018-08-02 DIAGNOSIS — Z1231 Encounter for screening mammogram for malignant neoplasm of breast: Secondary | ICD-10-CM

## 2018-09-14 ENCOUNTER — Ambulatory Visit
Admission: RE | Admit: 2018-09-14 | Discharge: 2018-09-14 | Disposition: A | Discharge status: Home / Self Care | Payer: PPO | Source: Ambulatory Visit | Attending: Internal Medicine | Admitting: Internal Medicine

## 2018-09-14 ENCOUNTER — Other Ambulatory Visit: Payer: Self-pay

## 2018-09-14 DIAGNOSIS — Z1231 Encounter for screening mammogram for malignant neoplasm of breast: Secondary | ICD-10-CM | POA: Diagnosis not present

## 2018-11-04 ENCOUNTER — Other Ambulatory Visit: Payer: Self-pay | Admitting: Obstetrics and Gynecology

## 2018-11-04 NOTE — Telephone Encounter (Signed)
Medication refill request: Vivelle-Dot Last AEX:  11/17/2017 JJ Next AEX: 11/22/2018 Last MMG (if hormonal medication request): 09/14/2018 BIRADS 1 Negative Density B Refill authorized: Pending #8 with no refills if appropriate. Please advise.

## 2018-11-22 ENCOUNTER — Ambulatory Visit: Payer: PPO | Admitting: Obstetrics and Gynecology

## 2018-11-26 DIAGNOSIS — E7849 Other hyperlipidemia: Secondary | ICD-10-CM | POA: Diagnosis not present

## 2018-12-02 DIAGNOSIS — B351 Tinea unguium: Secondary | ICD-10-CM | POA: Diagnosis not present

## 2018-12-02 DIAGNOSIS — L309 Dermatitis, unspecified: Secondary | ICD-10-CM | POA: Diagnosis not present

## 2018-12-02 DIAGNOSIS — K219 Gastro-esophageal reflux disease without esophagitis: Secondary | ICD-10-CM | POA: Diagnosis not present

## 2018-12-02 DIAGNOSIS — R197 Diarrhea, unspecified: Secondary | ICD-10-CM | POA: Diagnosis not present

## 2018-12-02 DIAGNOSIS — R030 Elevated blood-pressure reading, without diagnosis of hypertension: Secondary | ICD-10-CM | POA: Diagnosis not present

## 2018-12-02 DIAGNOSIS — E785 Hyperlipidemia, unspecified: Secondary | ICD-10-CM | POA: Diagnosis not present

## 2018-12-02 DIAGNOSIS — R300 Dysuria: Secondary | ICD-10-CM | POA: Diagnosis not present

## 2018-12-02 DIAGNOSIS — Z0000 Encounter for general adult medical examination without abnormal findings: Secondary | ICD-10-CM | POA: Diagnosis not present

## 2018-12-02 DIAGNOSIS — E663 Overweight: Secondary | ICD-10-CM | POA: Diagnosis not present

## 2018-12-02 DIAGNOSIS — Z87440 Personal history of urinary (tract) infections: Secondary | ICD-10-CM | POA: Diagnosis not present

## 2018-12-02 DIAGNOSIS — I77810 Thoracic aortic ectasia: Secondary | ICD-10-CM | POA: Diagnosis not present

## 2018-12-02 DIAGNOSIS — F325 Major depressive disorder, single episode, in full remission: Secondary | ICD-10-CM | POA: Diagnosis not present

## 2018-12-03 ENCOUNTER — Other Ambulatory Visit: Payer: Self-pay | Admitting: Obstetrics and Gynecology

## 2018-12-06 ENCOUNTER — Other Ambulatory Visit: Payer: Self-pay | Admitting: Obstetrics and Gynecology

## 2018-12-06 MED ORDER — PROGESTERONE MICRONIZED 100 MG PO CAPS: 100.0000 mg | ORAL_CAPSULE | Freq: Every day | ORAL | 0 refills | Status: DC

## 2018-12-06 NOTE — Telephone Encounter (Signed)
Patient is calling regarding refill on Progesterone. Patient stated that refill request from pharmacy was denied due to needing an appointment. Patient has annual exam scheduled for 12/09/2018 at 0930am.

## 2018-12-06 NOTE — Telephone Encounter (Signed)
Dr.Jertson, okay to give patient Progesterone 100 mg daily #30 0RF to get her to her appointment?

## 2018-12-07 ENCOUNTER — Other Ambulatory Visit: Payer: Self-pay

## 2018-12-07 NOTE — Progress Notes (Addendum)
68 y.o. [id number] Divorced  White or Caucasian Not Hispanic or Latino female here for annual exam. She is on HRT, she has some mild hot flashes since we lowered her dose. Tolerable. Lives with her partner on the weekends, so happy. Sexually active, no pain.     Patient's last menstrual period was 09/21/2001.          Sexually active: Yes.    The current method of family planning is post menopausal status.    Exercising: Yes.    walking Smoker:  no  Health Maintenance: Pap:10/20/2016 WNL with neg HR HPV, 09-21-13 WNL NEG HR HPV History of abnormal Pap:no MMG:09/14/2018 Birads 1 negative Colonoscopy:01-21-08 WNL per patient BMD:05-22-16 WNL TDaP:UTD with PCP Gardasil:N/A   reports that she quit smoking about 35 years ago. She has never used smokeless tobacco. She reports current alcohol use of about 6.0 standard drinks of alcohol per week. She reports that she does not use drugs. Daughter in [place] with 3 grandsons. Daughter in [place] with other grandson, pregnant with another boy. Her daughters are very close. She was an Futures trader. Retired.   Past Medical History:  Diagnosis Date  . Anemia   . Blood transfusion without reported diagnosis    5 Years ago due to internal bleeding  . Depression     Past Surgical History:  Procedure Laterality Date  . AUGMENTATION MAMMAPLASTY    . BREAST ENHANCEMENT SURGERY  [id number]   silicone inplants  . ESOPHAGOGASTRODUODENOSCOPY W/ BANDING  11/2008   duodenal divericular bleed-clipped    Current Outpatient Medications  Medication Sig Dispense Refill  . Ascorbic Acid (VITAMIN C PO) Take by mouth daily.     . Cholecalciferol (VITAMIN D PO) Take by mouth daily.     Marland Kitchen escitalopram (LEXAPRO) 10 MG tablet TAKE ONE TABLET EACH DAY 90 tablet 3  . estradiol (VIVELLE-DOT) 0.0375 MG/24HR APPLY ONE PATCH TWICE WEEKLY 8 patch 0  . Eszopiclone 3 MG TABS     . Multiple Vitamins-Minerals (MULTIVITAMIN PO) Take by mouth.    . Omega-3  Fatty Acids (FISH OIL PO) Take by mouth daily.    . progesterone (PROMETRIUM) 100 MG capsule Take 1 capsule (100 mg total) by mouth daily. 30 capsule 0   No current facility-administered medications for this visit.     Family History  Problem Relation Age of Onset  . Colon cancer Maternal Grandmother   . Breast cancer Sister        Half Sister    Review of Systems  Constitutional: Negative.   HENT: Negative.   Eyes: Negative.   Respiratory: Negative.   Cardiovascular: Negative.   Gastrointestinal: Negative.   Endocrine: Negative.   Genitourinary: Negative.   Musculoskeletal: Negative.   Skin: Negative.   Allergic/Immunologic: Negative.   Neurological: Negative.   Hematological: Negative.   Psychiatric/Behavioral: Negative.     Exam:   BP 118/70 (BP Location: Right Arm, Patient Position: Sitting, Cuff Size: Normal)   Pulse 80   Temp (!) 97 F (36.1 C) (Skin)   Ht 4' 11.5" (1.511 m)   Wt 155 lb 6.4 oz (70.5 kg)   LMP 09/21/2001   BMI 30.86 kg/m   Weight change: @WEIGHTCHANGE @ Height:   Height: 4' 11.5" (151.1 cm)  Ht Readings from Last 3 Encounters:  12/09/18 4' 11.5" (1.511 m)  11/17/17 4' 11.5" (1.511 m)  10/16/16 4' 11.5" (1.511 m)    General appearance: alert, cooperative and appears stated age Head: Normocephalic, without obvious abnormality, atraumatic Neck:  no adenopathy, supple, symmetrical, trachea midline and thyroid normal to inspection and palpation Lungs: clear to auscultation bilaterally Cardiovascular: regular rate and rhythm Breasts: normal appearance, no masses or tenderness Abdomen: soft, non-tender; non distended,  no masses,  no organomegaly Extremities: extremities normal, atraumatic, no cyanosis or edema Skin: Skin color, texture, turgor normal. No rashes or lesions Lymph nodes: Cervical, supraclavicular, and axillary nodes normal. No abnormal inguinal nodes palpated Neurologic: Grossly normal   Pelvic: External genitalia:  no lesions               Urethra:  normal appearing urethra with no masses, tenderness or lesions              Bartholins and Skenes: normal                 Vagina: normal appearing vagina with normal color and discharge, no lesions              Cervix: no lesions               Bimanual Exam:  Uterus:  normal size, contour, position, consistency, mobility, non-tender              Adnexa: no mass, fullness, tenderness               Rectovaginal: Confirms               Anus:  normal sphincter tone, no lesions  Chaperone was present for exam.  A:  Well Woman with normal exam  P:   Colonoscopy referral placed  Labs with primary  No pap this year  Discussed breast self exam  Discussed calcium and vit D intake  DEXA UTD  Mammogram UTD  Addendum: patient would like to continue HRT, script sent.

## 2018-12-09 ENCOUNTER — Encounter: Payer: Self-pay | Admitting: Obstetrics and Gynecology

## 2018-12-09 ENCOUNTER — Ambulatory Visit (INDEPENDENT_AMBULATORY_CARE_PROVIDER_SITE_OTHER): Payer: PPO | Admitting: Obstetrics and Gynecology

## 2018-12-09 ENCOUNTER — Other Ambulatory Visit: Payer: Self-pay

## 2018-12-09 VITALS — BP 118/70 | HR 80 | Temp 97.0°F | Ht 59.5 in | Wt 155.4 lb

## 2018-12-09 DIAGNOSIS — Z01419 Encounter for gynecological examination (general) (routine) without abnormal findings: Secondary | ICD-10-CM | POA: Diagnosis not present

## 2018-12-09 DIAGNOSIS — Z1211 Encounter for screening for malignant neoplasm of colon: Secondary | ICD-10-CM | POA: Diagnosis not present

## 2018-12-09 MED ORDER — ESTRADIOL 0.0375 MG/24HR TD PTTW: MEDICATED_PATCH | TRANSDERMAL | 3 refills | Status: DC

## 2018-12-09 MED ORDER — ESCITALOPRAM OXALATE 10 MG PO TABS: ORAL_TABLET | ORAL | 3 refills | Status: DC

## 2018-12-09 MED ORDER — PROGESTERONE MICRONIZED 100 MG PO CAPS: 100.0000 mg | ORAL_CAPSULE | Freq: Every day | ORAL | 3 refills | Status: DC

## 2018-12-09 NOTE — Patient Instructions (Signed)
EXERCISE AND DIET:  We recommended that you start or continue a regular exercise program for good health. Regular exercise means any activity that makes your heart beat faster and makes you sweat.  We recommend exercising at least 30 minutes per day at least 3 days a week, preferably 4 or 5.  We also recommend a diet low in fat and sugar.  Inactivity, poor dietary choices and obesity can cause diabetes, heart attack, stroke, and kidney damage, among others.    ALCOHOL AND SMOKING:  Women should limit their alcohol intake to no more than 7 drinks/beers/glasses of wine (combined, not each!) per week. Moderation of alcohol intake to this level decreases your risk of breast cancer and liver damage. And of course, no recreational drugs are part of a healthy lifestyle.  And absolutely no smoking or even second hand smoke. Most people know smoking can cause heart and lung diseases, but did you know it also contributes to weakening of your bones? Aging of your skin?  Yellowing of your teeth and nails?  CALCIUM AND VITAMIN D:  Adequate intake of calcium and Vitamin D are recommended.  The recommendations for exact amounts of these supplements seem to change often, but generally speaking 1,200 mg of calcium (between diet and supplement) and 800 units of Vitamin D per day seems prudent. Certain women may benefit from higher intake of Vitamin D.  If you are among these women, your doctor will have told you during your visit.    PAP SMEARS:  Pap smears, to check for cervical cancer or precancers,  have traditionally been done yearly, although recent scientific advances have shown that most women can have pap smears less often.  However, every woman still should have a physical exam from her gynecologist every year. It will include a breast check, inspection of the vulva and vagina to check for abnormal growths or skin changes, a visual exam of the cervix, and then an exam to evaluate the size and shape of the uterus and  ovaries.  And after 67 years of age, a rectal exam is indicated to check for rectal cancers. We will also provide age appropriate advice regarding health maintenance, like when you should have certain vaccines, screening for sexually transmitted diseases, bone density testing, colonoscopy, mammograms, etc.   MAMMOGRAMS:  All women over 40 years old should have a yearly mammogram. Many facilities now offer a "3D" mammogram, which may cost around $50 extra out of pocket. If possible,  we recommend you accept the option to have the 3D mammogram performed.  It both reduces the number of women who will be called back for extra views which then turn out to be normal, and it is better than the routine mammogram at detecting truly abnormal areas.    COLON CANCER SCREENING: Now recommend starting at age 45. At this time colonoscopy is not covered for routine screening until 50. There are take home tests that can be done between 45-49.   COLONOSCOPY:  Colonoscopy to screen for colon cancer is recommended for all women at age 50.  We know, you hate the idea of the prep.  We agree, BUT, having colon cancer and not knowing it is worse!!  Colon cancer so often starts as a polyp that can be seen and removed at colonscopy, which can quite literally save your life!  And if your first colonoscopy is normal and you have no family history of colon cancer, most women don't have to have it again for   10 years.  Once every ten years, you can do something that may end up saving your life, right?  We will be happy to help you get it scheduled when you are ready.  Be sure to check your insurance coverage so you understand how much it will cost.  It may be covered as a preventative service at no cost, but you should check your particular policy.      Breast Self-Awareness Breast self-awareness means being familiar with how your breasts look and feel. It involves checking your breasts regularly and reporting any changes to your  health care provider. Practicing breast self-awareness is important. A change in your breasts can be a sign of a serious medical problem. Being familiar with how your breasts look and feel allows you to find any problems early, when treatment is more likely to be successful. All women should practice breast self-awareness, including women who have had breast implants. How to do a breast self-exam One way to learn what is normal for your breasts and whether your breasts are changing is to do a breast self-exam. To do a breast self-exam: Look for Changes  1. Remove all the clothing above your waist. 2. Stand in front of a mirror in a room with good lighting. 3. Put your hands on your hips. 4. Push your hands firmly downward. 5. Compare your breasts in the mirror. Look for differences between them (asymmetry), such as: ? Differences in shape. ? Differences in size. ? Puckers, dips, and bumps in one breast and not the other. 6. Look at each breast for changes in your skin, such as: ? Redness. ? Scaly areas. 7. Look for changes in your nipples, such as: ? Discharge. ? Bleeding. ? Dimpling. ? Redness. ? A change in position. Feel for Changes Carefully feel your breasts for lumps and changes. It is best to do this while lying on your back on the floor and again while sitting or standing in the shower or tub with soapy water on your skin. Feel each breast in the following way:  Place the arm on the side of the breast you are examining above your head.  Feel your breast with the other hand.  Start in the nipple area and make  inch (2 cm) overlapping circles to feel your breast. Use the pads of your three middle fingers to do this. Apply light pressure, then medium pressure, then firm pressure. The light pressure will allow you to feel the tissue closest to the skin. The medium pressure will allow you to feel the tissue that is a little deeper. The firm pressure will allow you to feel the tissue  close to the ribs.  Continue the overlapping circles, moving downward over the breast until you feel your ribs below your breast.  Move one finger-width toward the center of the body. Continue to use the  inch (2 cm) overlapping circles to feel your breast as you move slowly up toward your collarbone.  Continue the up and down exam using all three pressures until you reach your armpit.  Write Down What You Find  Write down what is normal for each breast and any changes that you find. Keep a written record with breast changes or normal findings for each breast. By writing this information down, you do not need to depend only on memory for size, tenderness, or location. Write down where you are in your menstrual cycle, if you are still menstruating. If you are having trouble noticing differences   in your breasts, do not get discouraged. With time you will become more familiar with the variations in your breasts and more comfortable with the exam. How often should I examine my breasts? Examine your breasts every month. If you are breastfeeding, the best time to examine your breasts is after a feeding or after using a breast pump. If you menstruate, the best time to examine your breasts is 5-7 days after your period is over. During your period, your breasts are lumpier, and it may be more difficult to notice changes. When should I see my health care provider? See your health care provider if you notice:  A change in shape or size of your breasts or nipples.  A change in the skin of your breast or nipples, such as a reddened or scaly area.  Unusual discharge from your nipples.  A lump or thick area that was not there before.  Pain in your breasts.  Anything that concerns you.  

## 2018-12-09 NOTE — Addendum Note (Signed)
Addended by: Dorothy Spark on: 12/09/2018 11:52 AM   Modules accepted: Orders

## 2018-12-10 DIAGNOSIS — Z1331 Encounter for screening for depression: Secondary | ICD-10-CM | POA: Diagnosis not present

## 2018-12-29 ENCOUNTER — Other Ambulatory Visit: Payer: Self-pay | Admitting: Obstetrics and Gynecology

## 2018-12-29 DIAGNOSIS — Z01419 Encounter for gynecological examination (general) (routine) without abnormal findings: Secondary | ICD-10-CM

## 2018-12-29 NOTE — Telephone Encounter (Signed)
Patient would like refill on progesterone. States she only needs 12 pills before going out of town. Pharmacy on file.

## 2018-12-29 NOTE — Telephone Encounter (Signed)
Spoke with pt. Pt is going out of town for daughter's birth of baby. Will call pharmacy at [facility] and check to get Rx early, if not able to will call us back and maybe transfer Rx to another pharmacy to get refills.   Routing to provider for final review. Patient is agreeable to disposition. Will close encounter.

## 2018-12-29 NOTE — Telephone Encounter (Signed)
Med refill request: prometrium Last AEX: 12/09/2018 Next AEX: not scheduled Last MMG (if hormonal med) 09/14/18, birads 1, negative Refill authorized: #90. 3 RF, orders pended if approve.

## 2018-12-29 NOTE — Telephone Encounter (Signed)
I sent scripts for her estrogen and prometrium on 12/09/18. Please see the note and let the pharmacy know. Thanks.

## 2019-01-04 DIAGNOSIS — Z9189 Other specified personal risk factors, not elsewhere classified: Secondary | ICD-10-CM | POA: Diagnosis not present

## 2019-01-04 DIAGNOSIS — R05 Cough: Secondary | ICD-10-CM | POA: Diagnosis not present

## 2019-01-19 DIAGNOSIS — L918 Other hypertrophic disorders of the skin: Secondary | ICD-10-CM | POA: Diagnosis not present

## 2019-01-19 DIAGNOSIS — D2372 Other benign neoplasm of skin of left lower limb, including hip: Secondary | ICD-10-CM | POA: Diagnosis not present

## 2019-01-19 DIAGNOSIS — L570 Actinic keratosis: Secondary | ICD-10-CM | POA: Diagnosis not present

## 2019-01-19 DIAGNOSIS — L820 Inflamed seborrheic keratosis: Secondary | ICD-10-CM | POA: Diagnosis not present

## 2019-01-19 DIAGNOSIS — D225 Melanocytic nevi of trunk: Secondary | ICD-10-CM | POA: Diagnosis not present

## 2019-01-19 DIAGNOSIS — L821 Other seborrheic keratosis: Secondary | ICD-10-CM | POA: Diagnosis not present

## 2019-01-19 DIAGNOSIS — D1801 Hemangioma of skin and subcutaneous tissue: Secondary | ICD-10-CM | POA: Diagnosis not present

## 2019-05-06 DIAGNOSIS — Z1212 Encounter for screening for malignant neoplasm of rectum: Secondary | ICD-10-CM | POA: Diagnosis not present

## 2019-08-17 ENCOUNTER — Other Ambulatory Visit: Payer: Self-pay | Admitting: Internal Medicine

## 2019-08-17 DIAGNOSIS — Z1231 Encounter for screening mammogram for malignant neoplasm of breast: Secondary | ICD-10-CM

## 2019-09-15 ENCOUNTER — Ambulatory Visit: Payer: PPO

## 2019-09-23 ENCOUNTER — Other Ambulatory Visit: Payer: Self-pay | Admitting: Obstetrics and Gynecology

## 2019-10-25 DIAGNOSIS — M1612 Unilateral primary osteoarthritis, left hip: Secondary | ICD-10-CM | POA: Diagnosis not present

## 2019-10-25 DIAGNOSIS — M1611 Unilateral primary osteoarthritis, right hip: Secondary | ICD-10-CM | POA: Diagnosis not present

## 2019-11-04 ENCOUNTER — Ambulatory Visit: Payer: PPO

## 2019-11-16 ENCOUNTER — Ambulatory Visit: Payer: PPO

## 2019-11-22 ENCOUNTER — Other Ambulatory Visit: Payer: Self-pay | Admitting: Obstetrics and Gynecology

## 2019-11-23 ENCOUNTER — Other Ambulatory Visit: Payer: Self-pay | Admitting: Obstetrics and Gynecology

## 2019-11-23 NOTE — Telephone Encounter (Signed)
Medication refill request: Vivelle-dot  Last AEX:  12-09-2018 JJ  Next AEX: 05-23-20 Last MMG (if hormonal medication request): 09-14-2018 density B/BIRADS 1 negative. Scheduled for 11-29-19  Refill authorized: Today, please advise.   Medication pended for #8, 0RF. Please refill if appropriate.

## 2019-11-24 ENCOUNTER — Other Ambulatory Visit: Payer: Self-pay | Admitting: Obstetrics and Gynecology

## 2019-11-24 MED ORDER — PROMETRIUM 100 MG PO CAPS: 100.0000 mg | ORAL_CAPSULE | Freq: Every day | ORAL | 1 refills | Status: DC

## 2019-11-24 NOTE — Telephone Encounter (Signed)
Refill for estrogen and progesterone sent to get her to her annual exam.

## 2019-11-29 ENCOUNTER — Ambulatory Visit: Payer: PPO

## 2019-11-29 DIAGNOSIS — E785 Hyperlipidemia, unspecified: Secondary | ICD-10-CM | POA: Diagnosis not present

## 2019-11-29 DIAGNOSIS — R946 Abnormal results of thyroid function studies: Secondary | ICD-10-CM | POA: Diagnosis not present

## 2019-12-05 DIAGNOSIS — N898 Other specified noninflammatory disorders of vagina: Secondary | ICD-10-CM | POA: Diagnosis not present

## 2019-12-05 DIAGNOSIS — Z79890 Hormone replacement therapy (postmenopausal): Secondary | ICD-10-CM | POA: Diagnosis not present

## 2019-12-05 DIAGNOSIS — M545 Low back pain, unspecified: Secondary | ICD-10-CM | POA: Diagnosis not present

## 2019-12-05 DIAGNOSIS — R059 Cough, unspecified: Secondary | ICD-10-CM | POA: Diagnosis not present

## 2019-12-05 DIAGNOSIS — Z23 Encounter for immunization: Secondary | ICD-10-CM | POA: Diagnosis not present

## 2019-12-05 DIAGNOSIS — E785 Hyperlipidemia, unspecified: Secondary | ICD-10-CM | POA: Diagnosis not present

## 2019-12-05 DIAGNOSIS — G4733 Obstructive sleep apnea (adult) (pediatric): Secondary | ICD-10-CM | POA: Diagnosis not present

## 2019-12-05 DIAGNOSIS — R030 Elevated blood-pressure reading, without diagnosis of hypertension: Secondary | ICD-10-CM | POA: Diagnosis not present

## 2019-12-05 DIAGNOSIS — Z0000 Encounter for general adult medical examination without abnormal findings: Secondary | ICD-10-CM | POA: Diagnosis not present

## 2019-12-05 DIAGNOSIS — Z87440 Personal history of urinary (tract) infections: Secondary | ICD-10-CM | POA: Diagnosis not present

## 2019-12-05 DIAGNOSIS — L309 Dermatitis, unspecified: Secondary | ICD-10-CM | POA: Diagnosis not present

## 2019-12-05 DIAGNOSIS — G4700 Insomnia, unspecified: Secondary | ICD-10-CM | POA: Diagnosis not present

## 2019-12-05 DIAGNOSIS — F430 Acute stress reaction: Secondary | ICD-10-CM | POA: Diagnosis not present

## 2019-12-05 DIAGNOSIS — R82998 Other abnormal findings in urine: Secondary | ICD-10-CM | POA: Diagnosis not present

## 2019-12-13 DIAGNOSIS — M1612 Unilateral primary osteoarthritis, left hip: Secondary | ICD-10-CM | POA: Diagnosis not present

## 2019-12-20 ENCOUNTER — Other Ambulatory Visit: Payer: Self-pay | Admitting: Obstetrics and Gynecology

## 2019-12-20 DIAGNOSIS — F32A Depression, unspecified: Secondary | ICD-10-CM

## 2019-12-20 DIAGNOSIS — Z01812 Encounter for preprocedural laboratory examination: Secondary | ICD-10-CM | POA: Diagnosis not present

## 2019-12-20 NOTE — Telephone Encounter (Signed)
Medication refill request: Lexapro 10mg   Last AEX:  12/09/18 Next AEX: 05/23/20 Last MMG (if hormonal medication request): NA Refill authorized: 90/0

## 2019-12-22 ENCOUNTER — Other Ambulatory Visit: Payer: Self-pay

## 2019-12-22 ENCOUNTER — Ambulatory Visit
Admission: RE | Admit: 2019-12-22 | Discharge: 2019-12-22 | Disposition: A | Discharge status: Home / Self Care | Payer: PPO | Source: Ambulatory Visit | Attending: Internal Medicine | Admitting: Internal Medicine

## 2019-12-22 DIAGNOSIS — Z1231 Encounter for screening mammogram for malignant neoplasm of breast: Secondary | ICD-10-CM

## 2019-12-23 DIAGNOSIS — M1612 Unilateral primary osteoarthritis, left hip: Secondary | ICD-10-CM | POA: Diagnosis not present

## 2019-12-26 DIAGNOSIS — M25652 Stiffness of left hip, not elsewhere classified: Secondary | ICD-10-CM | POA: Diagnosis not present

## 2019-12-26 DIAGNOSIS — R531 Weakness: Secondary | ICD-10-CM | POA: Diagnosis not present

## 2019-12-26 DIAGNOSIS — R262 Difficulty in walking, not elsewhere classified: Secondary | ICD-10-CM | POA: Diagnosis not present

## 2019-12-26 DIAGNOSIS — Z96642 Presence of left artificial hip joint: Secondary | ICD-10-CM | POA: Diagnosis not present

## 2019-12-28 ENCOUNTER — Other Ambulatory Visit: Payer: Self-pay | Admitting: Internal Medicine

## 2019-12-28 DIAGNOSIS — R928 Other abnormal and inconclusive findings on diagnostic imaging of breast: Secondary | ICD-10-CM

## 2020-01-03 DIAGNOSIS — Z9889 Other specified postprocedural states: Secondary | ICD-10-CM | POA: Diagnosis not present

## 2020-01-03 DIAGNOSIS — Z96642 Presence of left artificial hip joint: Secondary | ICD-10-CM | POA: Diagnosis not present

## 2020-01-04 DIAGNOSIS — Z1212 Encounter for screening for malignant neoplasm of rectum: Secondary | ICD-10-CM | POA: Diagnosis not present

## 2020-01-10 DIAGNOSIS — R531 Weakness: Secondary | ICD-10-CM | POA: Diagnosis not present

## 2020-01-10 DIAGNOSIS — R262 Difficulty in walking, not elsewhere classified: Secondary | ICD-10-CM | POA: Diagnosis not present

## 2020-01-10 DIAGNOSIS — M25652 Stiffness of left hip, not elsewhere classified: Secondary | ICD-10-CM | POA: Diagnosis not present

## 2020-01-10 DIAGNOSIS — Z96642 Presence of left artificial hip joint: Secondary | ICD-10-CM | POA: Diagnosis not present

## 2020-01-12 ENCOUNTER — Other Ambulatory Visit: Payer: Self-pay | Admitting: Internal Medicine

## 2020-01-12 ENCOUNTER — Other Ambulatory Visit: Payer: Self-pay

## 2020-01-12 ENCOUNTER — Ambulatory Visit
Admission: RE | Admit: 2020-01-12 | Discharge: 2020-01-12 | Disposition: A | Discharge status: Home / Self Care | Payer: PPO | Source: Ambulatory Visit | Attending: Internal Medicine | Admitting: Internal Medicine

## 2020-01-12 ENCOUNTER — Ambulatory Visit: Admission: RE | Admit: 2020-01-12 | Payer: PPO | Source: Ambulatory Visit

## 2020-01-12 DIAGNOSIS — R928 Other abnormal and inconclusive findings on diagnostic imaging of breast: Secondary | ICD-10-CM

## 2020-01-12 DIAGNOSIS — R922 Inconclusive mammogram: Secondary | ICD-10-CM | POA: Diagnosis not present

## 2020-01-19 DIAGNOSIS — Z87440 Personal history of urinary (tract) infections: Secondary | ICD-10-CM | POA: Diagnosis not present

## 2020-01-19 DIAGNOSIS — R197 Diarrhea, unspecified: Secondary | ICD-10-CM | POA: Diagnosis not present

## 2020-01-23 DIAGNOSIS — R197 Diarrhea, unspecified: Secondary | ICD-10-CM | POA: Diagnosis not present

## 2020-04-26 ENCOUNTER — Encounter: Payer: Self-pay | Admitting: Internal Medicine

## 2020-05-23 ENCOUNTER — Ambulatory Visit: Payer: Self-pay | Admitting: Obstetrics and Gynecology

## 2020-05-23 ENCOUNTER — Other Ambulatory Visit: Payer: Self-pay | Admitting: Obstetrics and Gynecology

## 2020-05-23 DIAGNOSIS — R519 Headache, unspecified: Secondary | ICD-10-CM | POA: Diagnosis not present

## 2020-05-23 DIAGNOSIS — J029 Acute pharyngitis, unspecified: Secondary | ICD-10-CM | POA: Diagnosis not present

## 2020-05-23 DIAGNOSIS — J309 Allergic rhinitis, unspecified: Secondary | ICD-10-CM | POA: Diagnosis not present

## 2020-05-24 NOTE — Telephone Encounter (Signed)
Annual exam scheduled on 06/05/20, previous annual exam 10/20, mammogram was in Nov 2021

## 2020-05-31 NOTE — Progress Notes (Signed)
70 y.o. Nina Martinez Divorced White or Caucasian Not Hispanic or Latino female here for annual exam.  Lives with her partner of 6 years.  She is on HRT, she is doing well on her current dose. Still with occasional vasomotor symptoms.  No vaginal bleeding.   She has had diarrhea off and on for months. Having a loose BM 1-2 x a day. She has spoken with her primary, had stool testing. Negative evaluation so far. She has a colonoscopy scheduled.     Patient's last menstrual period was 09/21/2001.          Sexually active: Yes.    The current method of family planning is post menopausal status.    Exercising: Yes.    walking Smoker:  no  Health Maintenance: Pap:  10/20/2016 WNL with neg HR HPV,09-21-13 WNL NEG HR HPV History of abnormal Pap:  no MMG:  01/12/20 Right diagnostic bi-rads 1 neg  BMD:   05-22-16 WNL Colonoscopy: 01-21-08 WNL per patient TDaP:  Up to date with PCP per patient  Gardasil: NA   reports that she quit smoking about 37 years ago. She has never used smokeless tobacco. She reports current alcohol use of about 6.0 standard drinks of alcohol per week. She reports that she does not use drugs. 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 [place] with 3 grandsons. Daughter in [place] with 2 othergrandson's. Her daughters are very close.  Past Medical History:  Diagnosis Date  . Anemia   . Blood transfusion without reported diagnosis    5 Years ago due to internal bleeding  . Depression     Past Surgical History:  Procedure Laterality Date  . AUGMENTATION MAMMAPLASTY    . BREAST ENHANCEMENT SURGERY  8144   silicone inplants  . ESOPHAGOGASTRODUODENOSCOPY W/ BANDING  11/2008   duodenal divericular bleed-clipped  . left hip replacement      Current Outpatient Medications  Medication Sig Dispense Refill  . Ascorbic Acid (VITAMIN C PO) Take by mouth daily.     . Cholecalciferol (VITAMIN D PO) Take by mouth daily.     Marland Kitchen escitalopram (LEXAPRO) 10 MG tablet TAKE ONE TABLET  EACH DAY 90 tablet 1  . estradiol (VIVELLE-DOT) 0.0375 MG/24HR APPLY ONE PATCH TWICE WEEKLY 8 patch 0  . Eszopiclone 3 MG TABS     . Multiple Vitamins-Minerals (MULTIVITAMIN PO) Take by mouth.    . Omega-3 Fatty Acids (FISH OIL PO) Take by mouth daily.    . progesterone (PROMETRIUM) 100 MG capsule Take 1 capsule (100 mg total) by mouth daily. 90 capsule 3   No current facility-administered medications for this visit.    Family History  Problem Relation Age of Onset  . Colon cancer Maternal Grandmother   . Breast cancer Sister        43 Sister    Review of Systems  All other systems reviewed and are negative.   Exam:   BP 134/76   Pulse 76   Ht 5' (1.524 m)   Wt 153 lb (69.4 kg)   LMP 09/21/2001   SpO2 96%   BMI 29.88 kg/m   Weight change: @WEIGHTCHANGE @ Height:   Height: 5' (152.4 cm)  Ht Readings from Last 3 Encounters:  06/05/20 5' (1.524 m)  12/09/18 4' 11.5" (1.511 m)  11/17/17 4' 11.5" (1.511 m)    General appearance: alert, cooperative and appears stated age Head: Normocephalic, without obvious abnormality, atraumatic Neck: no adenopathy, supple, symmetrical, trachea midline and thyroid normal to inspection and palpation Lungs: clear  to auscultation bilaterally Cardiovascular: regular rate and rhythm Breasts: normal appearance, no masses or tenderness, bilateral implants Abdomen: soft, non-tender; non distended,  no masses,  no organomegaly Extremities: extremities normal, atraumatic, no cyanosis or edema Skin: Skin color, texture, turgor normal. No rashes or lesions Lymph nodes: Cervical, supraclavicular, and axillary nodes normal. No abnormal inguinal nodes palpated Neurologic: Grossly normal   Pelvic: External genitalia:  no lesions              Urethra:  normal appearing urethra with no masses, tenderness or lesions              Bartholins and Skenes: normal                 Vagina: normal appearing vagina with normal color and discharge, no lesions               Cervix: no lesions               Bimanual Exam:  Uterus:  normal size, contour, position, consistency, mobility, non-tender              Adnexa: no mass, fullness, tenderness               Rectovaginal: Confirms               Anus:  normal sphincter tone, no lesions    1. Encounter for gynecological examination without abnormal finding Discussed breast self exam Discussed calcium and vit D intake Labs with primary DEXA UTD Mammogram UTD Colonoscopy scheduled Pap next year  2. Hormone replacement therapy Wants to continue, aware of risks.  - estradiol (VIVELLE-DOT) 0.0375 MG/24HR; Place 1 patch onto the skin 2 (two) times a week.  Dispense: 24 patch; Refill: 3 - progesterone (PROMETRIUM) 100 MG capsule; Take 1 capsule (100 mg total) by mouth daily.  Dispense: 90 capsule; Refill: 3

## 2020-06-05 ENCOUNTER — Ambulatory Visit (INDEPENDENT_AMBULATORY_CARE_PROVIDER_SITE_OTHER): Payer: PPO | Admitting: Obstetrics and Gynecology

## 2020-06-05 ENCOUNTER — Encounter: Payer: Self-pay | Admitting: Obstetrics and Gynecology

## 2020-06-05 ENCOUNTER — Ambulatory Visit (AMBULATORY_SURGERY_CENTER): Payer: Self-pay

## 2020-06-05 ENCOUNTER — Other Ambulatory Visit: Payer: Self-pay

## 2020-06-05 VITALS — BP 134/76 | HR 76 | Ht 60.0 in | Wt 153.0 lb

## 2020-06-05 VITALS — Ht 59.5 in | Wt 154.0 lb

## 2020-06-05 DIAGNOSIS — Z1211 Encounter for screening for malignant neoplasm of colon: Secondary | ICD-10-CM

## 2020-06-05 DIAGNOSIS — Z79890 Hormone replacement therapy (postmenopausal): Secondary | ICD-10-CM | POA: Diagnosis not present

## 2020-06-05 DIAGNOSIS — Z01419 Encounter for gynecological examination (general) (routine) without abnormal findings: Secondary | ICD-10-CM

## 2020-06-05 MED ORDER — PROGESTERONE MICRONIZED 100 MG PO CAPS: 100.0000 mg | ORAL_CAPSULE | Freq: Every day | ORAL | 3 refills | Status: DC

## 2020-06-05 MED ORDER — ESTRADIOL 0.0375 MG/24HR TD PTTW: 1.0000 | MEDICATED_PATCH | TRANSDERMAL | 3 refills | Status: DC

## 2020-06-05 MED ORDER — SUTAB 1479-225-188 MG PO TABS: 12.0000 | ORAL_TABLET | ORAL | 0 refills | Status: DC

## 2020-06-05 NOTE — Progress Notes (Signed)
No allergies to soy or egg Pt is not on blood thinners or diet pills Denies issues with sedation/intubation Denies atrial flutter/fib Denies constipation   Pt is aware of Covid safety and care partner requirements.      

## 2020-06-05 NOTE — Patient Instructions (Signed)

## 2020-06-12 ENCOUNTER — Other Ambulatory Visit: Payer: Self-pay | Admitting: Obstetrics and Gynecology

## 2020-06-12 DIAGNOSIS — F32A Depression, unspecified: Secondary | ICD-10-CM

## 2020-06-12 NOTE — Telephone Encounter (Signed)
Medication refill request: lexapro 10mg  Last AEX:  05-16-2020 Next AEX: not scheduled Last MMG (if hormonal medication request): n/a Refill authorized: please approve if appropriate

## 2020-06-19 ENCOUNTER — Encounter: Payer: Self-pay | Admitting: Internal Medicine

## 2020-06-19 DIAGNOSIS — H43391 Other vitreous opacities, right eye: Secondary | ICD-10-CM | POA: Diagnosis not present

## 2020-06-19 DIAGNOSIS — H5203 Hypermetropia, bilateral: Secondary | ICD-10-CM | POA: Diagnosis not present

## 2020-06-19 DIAGNOSIS — H52223 Regular astigmatism, bilateral: Secondary | ICD-10-CM | POA: Diagnosis not present

## 2020-06-19 DIAGNOSIS — H524 Presbyopia: Secondary | ICD-10-CM | POA: Diagnosis not present

## 2020-06-22 ENCOUNTER — Other Ambulatory Visit: Payer: Self-pay

## 2020-06-22 ENCOUNTER — Ambulatory Visit (AMBULATORY_SURGERY_CENTER): Payer: PPO | Admitting: Internal Medicine

## 2020-06-22 ENCOUNTER — Encounter: Payer: Self-pay | Admitting: Internal Medicine

## 2020-06-22 VITALS — BP 137/88 | HR 69 | Temp 97.5°F | Resp 14 | Ht 59.5 in | Wt 154.0 lb

## 2020-06-22 DIAGNOSIS — Z1211 Encounter for screening for malignant neoplasm of colon: Secondary | ICD-10-CM | POA: Diagnosis not present

## 2020-06-22 DIAGNOSIS — R197 Diarrhea, unspecified: Secondary | ICD-10-CM | POA: Diagnosis not present

## 2020-06-22 MED ORDER — SODIUM CHLORIDE 0.9 % IV SOLN: 500.0000 mL | INTRAVENOUS | Status: DC

## 2020-06-22 NOTE — Progress Notes (Signed)
A/ox3, pleased with MAC, report to RN 

## 2020-06-22 NOTE — Progress Notes (Signed)
Called to room to assist during endoscopic procedure.  Patient ID and intended procedure confirmed with present staff. Received instructions for my participation in the procedure from the performing physician.  

## 2020-06-22 NOTE — Op Note (Signed)
[place] Patient Name: Nina Martinez Procedure Date: 06/22/2020 4:21 PM MRN: [id number] Endoscopist: Docia Chuck. Henrene Pastor , MD Age: 70 Referring MD:  Date of Birth: [birth year] Gender: Female Account #: [id number] Procedure:                Colonoscopy with biopsies Indications:              Screening for colorectal malignant neoplasm.                            Previous examination 2003 (Dr. Olevia Perches) without                            neoplasia. Also, chronic diarrhea of uncertain                            etiology Medicines:                Monitored Anesthesia Care Procedure:                Pre-Anesthesia Assessment:                           - Prior to the procedure, a History and Physical                            was performed, and patient medications and                            allergies were reviewed. The patient's tolerance of                            previous anesthesia was also reviewed. The risks                            and benefits of the procedure and the sedation                            options and risks were discussed with the patient.                            All questions were answered, and informed consent                            was obtained. Prior Anticoagulants: The patient has                            taken no previous anticoagulant or antiplatelet                            agents. ASA Grade Assessment: II - A patient with                            mild systemic disease. After reviewing the risks  and benefits, the patient was deemed in                            satisfactory condition to undergo the procedure.                           After obtaining informed consent, the colonoscope                            was passed under direct vision. Throughout the                            procedure, the patient's blood pressure, pulse, and                            oxygen saturations were monitored continuously.  The                            Olympus CF-HQ190 [id number] [id number]) Colonoscope was                            introduced through the anus and advanced to the the                            cecum, identified by appendiceal orifice and                            ileocecal valve. The ileocecal valve, appendiceal                            orifice, and rectum were photographed. The quality                            of the bowel preparation was excellent. The                            colonoscopy was performed without difficulty. The                            patient tolerated the procedure well. The bowel                            preparation used was SUPREP via split dose                            instruction. Scope In: 4:49:35 PM Scope Out: 5:03:35 PM Scope Withdrawal Time: 0 hours 7 minutes 18 seconds  Total Procedure Duration: 0 hours 14 minutes 0 seconds  Findings:                 Many small and large-mouthed diverticula were found                            in the entire colon. Nonbleeding right colon AVM.  The entire examined colon appeared otherwise normal                            on direct and retroflexion views. Biopsies for                            histology were taken with a cold forceps from the                            entire colon for evaluation of microscopic colitis. Complications:            No immediate complications. Estimated blood loss:                            None. Estimated Blood Loss:     Estimated blood loss: none. Impression:               - Diverticulosis in the entire examined colon.                            Right colon AVM.                           - The entire examined colon is otherwise normal on                            direct and retroflexion views. Recommendation:           - Repeat colonoscopy in 10 years for screening                            purposes.                           - Patient has a contact number  available for                            emergencies. The signs and symptoms of potential                            delayed complications were discussed with the                            patient. Return to normal activities tomorrow.                            Written discharge instructions were provided to the                            patient.                           - Resume previous diet.                           - Continue present medications.                           -  Await pathology results.                           - Please schedule an office appointment with Dr.                            Henrene Pastor at your nearest convenience to further                            evaluate issues with diarrhea Docia Chuck. Henrene Pastor, MD 06/22/2020 5:10:55 PM This report has been signed electronically.

## 2020-06-22 NOTE — Patient Instructions (Signed)
HANDOUT GIVEN FOR DIVERTICULOSIS.  YOU HAD AN ENDOSCOPIC PROCEDURE TODAY AT [facility]:   Refer to the procedure report that was given to you for any specific questions about what was found during the examination.  If the procedure report does not answer your questions, please call your gastroenterologist to clarify.  If you requested that your care partner not be given the details of your procedure findings, then the procedure report has been included in a sealed envelope for you to review at your convenience later.  YOU SHOULD EXPECT: Some feelings of bloating in the abdomen. Passage of more gas than usual.  Walking can help get rid of the air that was put into your GI tract during the procedure and reduce the bloating. If you had a lower endoscopy (such as a colonoscopy or flexible sigmoidoscopy) you may notice spotting of blood in your stool or on the toilet paper. If you underwent a bowel prep for your procedure, you may not have a normal bowel movement for a few days.  Please Note:  You might notice some irritation and congestion in your nose or some drainage.  This is from the oxygen used during your procedure.  There is no need for concern and it should clear up in a day or so.  SYMPTOMS TO REPORT IMMEDIATELY:   Following lower endoscopy (colonoscopy or flexible sigmoidoscopy):  Excessive amounts of blood in the stool  Significant tenderness or worsening of abdominal pains  Swelling of the abdomen that is new, acute  Fever of 100F or higher  For urgent or emergent issues, a gastroenterologist can be reached at any hour by calling [phone]. Do not use MyChart messaging for urgent concerns.    DIET:  We do recommend a small meal at first, but then you may proceed to your regular diet.  Drink plenty of fluids but you should avoid alcoholic beverages for 24 hours.  ACTIVITY:  You should plan to take it easy for the rest of today and you should NOT DRIVE or use  heavy machinery until tomorrow (because of the sedation medicines used during the test).    FOLLOW UP: Our staff will call the number listed on your records 48-72 hours following your procedure to check on you and address any questions or concerns that you may have regarding the information given to you following your procedure. If we do not reach you, we will leave a message.  We will attempt to reach you two times.  During this call, we will ask if you have developed any symptoms of COVID 19. If you develop any symptoms (ie: fever, flu-like symptoms, shortness of breath, cough etc.) before then, please call [phone].  If you test positive for Covid 19 in the 2 weeks post procedure, please call and report this information to [place].    If any biopsies were taken you will be contacted by phone or by letter within the next 1-3 weeks.  Please call us at [phone] if you have not heard about the biopsies in 3 weeks.    SIGNATURES/CONFIDENTIALITY: You and/or your care partner have signed paperwork which will be entered into your electronic medical record.  These signatures attest to the fact that that the information above on your After Visit Summary has been reviewed and is understood.  Full responsibility of the confidentiality of this discharge information lies with you and/or your care-partner.

## 2020-06-22 NOTE — Progress Notes (Signed)
Vitals by Braxton 

## 2020-06-26 ENCOUNTER — Telehealth: Payer: Self-pay | Admitting: *Deleted

## 2020-06-26 NOTE — Telephone Encounter (Signed)
  Follow up Call-  Call back number 06/22/2020  Post procedure Call Back phone  # [phone]  Permission to leave phone message Yes  Some recent data might be hidden     Patient questions:  Do you have a fever, pain , or abdominal swelling? No. Pain Score  0 *  Have you tolerated food without any problems? Yes.    Have you been able to return to your normal activities? Yes.    Do you have any questions about your discharge instructions: Diet   No. Medications  No. Follow up visit  No.  Do you have questions or concerns about your Care? No.  Actions: * If pain score is 4 or above: No action needed, pain <4.  1. Have you developed a fever since your procedure? no  2.   Have you had an respiratory symptoms (SOB or cough) since your procedure? no  3.   Have you tested positive for COVID 19 since your procedure no  4.   Have you had any family members/close contacts diagnosed with the COVID 19 since your procedure?  no   If yes to any of these questions please route to Joylene John, RN and Joella Prince, RN

## 2020-06-26 NOTE — Telephone Encounter (Signed)
First attempt, left VM.  

## 2020-07-10 ENCOUNTER — Encounter: Payer: Self-pay | Admitting: Internal Medicine

## 2020-10-30 DIAGNOSIS — H524 Presbyopia: Secondary | ICD-10-CM | POA: Diagnosis not present

## 2020-10-30 DIAGNOSIS — L821 Other seborrheic keratosis: Secondary | ICD-10-CM | POA: Diagnosis not present

## 2020-10-30 DIAGNOSIS — H0012 Chalazion right lower eyelid: Secondary | ICD-10-CM | POA: Diagnosis not present

## 2020-10-30 DIAGNOSIS — L817 Pigmented purpuric dermatosis: Secondary | ICD-10-CM | POA: Diagnosis not present

## 2020-10-30 DIAGNOSIS — B0089 Other herpesviral infection: Secondary | ICD-10-CM | POA: Diagnosis not present

## 2020-10-30 DIAGNOSIS — L218 Other seborrheic dermatitis: Secondary | ICD-10-CM | POA: Diagnosis not present

## 2020-10-30 DIAGNOSIS — L309 Dermatitis, unspecified: Secondary | ICD-10-CM | POA: Diagnosis not present

## 2020-10-30 DIAGNOSIS — H5203 Hypermetropia, bilateral: Secondary | ICD-10-CM | POA: Diagnosis not present

## 2020-10-30 DIAGNOSIS — H52223 Regular astigmatism, bilateral: Secondary | ICD-10-CM | POA: Diagnosis not present

## 2020-11-08 DIAGNOSIS — M1611 Unilateral primary osteoarthritis, right hip: Secondary | ICD-10-CM | POA: Diagnosis not present

## 2020-11-08 DIAGNOSIS — M1612 Unilateral primary osteoarthritis, left hip: Secondary | ICD-10-CM | POA: Diagnosis not present

## 2020-11-29 DIAGNOSIS — R946 Abnormal results of thyroid function studies: Secondary | ICD-10-CM | POA: Diagnosis not present

## 2020-11-29 DIAGNOSIS — E785 Hyperlipidemia, unspecified: Secondary | ICD-10-CM | POA: Diagnosis not present

## 2020-12-06 DIAGNOSIS — G4700 Insomnia, unspecified: Secondary | ICD-10-CM | POA: Diagnosis not present

## 2020-12-06 DIAGNOSIS — E785 Hyperlipidemia, unspecified: Secondary | ICD-10-CM | POA: Diagnosis not present

## 2020-12-06 DIAGNOSIS — Z23 Encounter for immunization: Secondary | ICD-10-CM | POA: Diagnosis not present

## 2020-12-06 DIAGNOSIS — Z1212 Encounter for screening for malignant neoplasm of rectum: Secondary | ICD-10-CM | POA: Diagnosis not present

## 2020-12-06 DIAGNOSIS — E663 Overweight: Secondary | ICD-10-CM | POA: Diagnosis not present

## 2020-12-06 DIAGNOSIS — R1312 Dysphagia, oropharyngeal phase: Secondary | ICD-10-CM | POA: Diagnosis not present

## 2020-12-06 DIAGNOSIS — Z0000 Encounter for general adult medical examination without abnormal findings: Secondary | ICD-10-CM | POA: Diagnosis not present

## 2020-12-06 DIAGNOSIS — Z1331 Encounter for screening for depression: Secondary | ICD-10-CM | POA: Diagnosis not present

## 2020-12-06 DIAGNOSIS — M25559 Pain in unspecified hip: Secondary | ICD-10-CM | POA: Diagnosis not present

## 2020-12-06 DIAGNOSIS — R059 Cough, unspecified: Secondary | ICD-10-CM | POA: Diagnosis not present

## 2020-12-06 DIAGNOSIS — F325 Major depressive disorder, single episode, in full remission: Secondary | ICD-10-CM | POA: Diagnosis not present

## 2020-12-06 DIAGNOSIS — N898 Other specified noninflammatory disorders of vagina: Secondary | ICD-10-CM | POA: Diagnosis not present

## 2020-12-06 DIAGNOSIS — R030 Elevated blood-pressure reading, without diagnosis of hypertension: Secondary | ICD-10-CM | POA: Diagnosis not present

## 2020-12-06 DIAGNOSIS — R82998 Other abnormal findings in urine: Secondary | ICD-10-CM | POA: Diagnosis not present

## 2021-02-07 DIAGNOSIS — M1611 Unilateral primary osteoarthritis, right hip: Secondary | ICD-10-CM | POA: Diagnosis not present

## 2021-02-08 DIAGNOSIS — Z01812 Encounter for preprocedural laboratory examination: Secondary | ICD-10-CM | POA: Diagnosis not present

## 2021-02-10 HISTORY — PX: TOTAL HIP ARTHROPLASTY: SHX124

## 2021-02-12 DIAGNOSIS — G479 Sleep disorder, unspecified: Secondary | ICD-10-CM | POA: Diagnosis not present

## 2021-02-12 DIAGNOSIS — R0602 Shortness of breath: Secondary | ICD-10-CM | POA: Diagnosis not present

## 2021-02-12 DIAGNOSIS — J069 Acute upper respiratory infection, unspecified: Secondary | ICD-10-CM | POA: Diagnosis not present

## 2021-02-12 DIAGNOSIS — R0981 Nasal congestion: Secondary | ICD-10-CM | POA: Diagnosis not present

## 2021-02-12 DIAGNOSIS — R051 Acute cough: Secondary | ICD-10-CM | POA: Diagnosis not present

## 2021-02-12 DIAGNOSIS — R5383 Other fatigue: Secondary | ICD-10-CM | POA: Diagnosis not present

## 2021-02-12 DIAGNOSIS — Z1152 Encounter for screening for COVID-19: Secondary | ICD-10-CM | POA: Diagnosis not present

## 2021-02-12 DIAGNOSIS — R509 Fever, unspecified: Secondary | ICD-10-CM | POA: Diagnosis not present

## 2021-02-28 ENCOUNTER — Other Ambulatory Visit: Payer: Self-pay | Admitting: Internal Medicine

## 2021-02-28 DIAGNOSIS — Z1231 Encounter for screening mammogram for malignant neoplasm of breast: Secondary | ICD-10-CM

## 2021-03-14 ENCOUNTER — Ambulatory Visit
Admission: RE | Admit: 2021-03-14 | Discharge: 2021-03-14 | Disposition: A | Discharge status: Home / Self Care | Payer: PPO | Source: Ambulatory Visit | Attending: Internal Medicine | Admitting: Internal Medicine

## 2021-03-14 DIAGNOSIS — Z1231 Encounter for screening mammogram for malignant neoplasm of breast: Secondary | ICD-10-CM

## 2021-03-27 DIAGNOSIS — M1611 Unilateral primary osteoarthritis, right hip: Secondary | ICD-10-CM | POA: Diagnosis not present

## 2021-04-05 DIAGNOSIS — Z96641 Presence of right artificial hip joint: Secondary | ICD-10-CM | POA: Diagnosis not present

## 2021-04-05 DIAGNOSIS — M1611 Unilateral primary osteoarthritis, right hip: Secondary | ICD-10-CM | POA: Diagnosis not present

## 2021-04-16 DIAGNOSIS — M1611 Unilateral primary osteoarthritis, right hip: Secondary | ICD-10-CM | POA: Diagnosis not present

## 2021-04-16 DIAGNOSIS — Z9889 Other specified postprocedural states: Secondary | ICD-10-CM | POA: Diagnosis not present

## 2021-06-17 ENCOUNTER — Other Ambulatory Visit: Payer: Self-pay | Admitting: *Deleted

## 2021-06-17 DIAGNOSIS — F32A Depression, unspecified: Secondary | ICD-10-CM

## 2021-06-17 MED ORDER — ESCITALOPRAM OXALATE 10 MG PO TABS: ORAL_TABLET | ORAL | 0 refills | Status: DC

## 2021-06-17 NOTE — Telephone Encounter (Signed)
Annual exam scheduled on 06/24/21 ?Last annual exam 06/05/20 ? ?

## 2021-06-17 NOTE — Progress Notes (Signed)
71 y.o. Nina Martinez Divorced White or Caucasian Not Hispanic or Latino female here for annual exam.  She would like a recommendation on a probiotic.  ?She is on HRT, wants to stay on them, willing to try the lower dose. No vaginal bleeding. Aware of the risks of HRT.  ?She has been with partner x 7 years, have moved in together. No dyspareunia. ? ?She has long term issues with diarrhea. IBS.  ? ?She has had some issues with urge incontinence, better recently. Leaks a small amount a couple of times a week. Wears a mini-pad, mainly leaks a small amount. Rarely will leak a large amounts.  ?  ? ?Patient's last menstrual period was 09/21/2001.          ?Sexually active: Yes.    ?The current method of family planning is post menopausal status.    ?Exercising: Yes.     Walking  ?Smoker:  no ? ?Health Maintenance: ?Pap:   10/20/2016 WNL with neg HR HPV, 09-21-13 WNL NEG HR HPV  ?History of abnormal Pap:  no ?MMG:  03/14/21  Bi-rads 1 neg  ?BMD:   05-22-16 WNL ?Colonoscopy: 06/22/20 WNL per patient  ?TDaP:  Up to date with PCP per patient  ?Gardasil: N/a ? ? reports that she quit smoking about 38 years ago. She has never used smokeless tobacco. She reports current alcohol use of about 6.0 standard drinks per week. She reports that she does not use drugs. 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 Daughter in [place] with 3 grandsons. Daughter in [place] with 2 other grandson's. Her daughters are very close.  ?  ? ?Past Medical History:  ?Diagnosis Date  ? Allergy   ? seasonal  ? Anemia   ? Anxiety   ? Blood transfusion without reported diagnosis   ? 5 Years ago due to internal bleeding  ? Depression   ? GERD (gastroesophageal reflux disease)   ? ? ?Past Surgical History:  ?Procedure Laterality Date  ? AUGMENTATION MAMMAPLASTY    ? BREAST ENHANCEMENT SURGERY  2000  ? silicone inplants  ? ESOPHAGOGASTRODUODENOSCOPY W/ BANDING  11/2008  ? duodenal divericular bleed-clipped  ? left hip replacement    ? TOTAL HIP ARTHROPLASTY Right 2023  ? UPPER  GASTROINTESTINAL ENDOSCOPY    ? ? ?Current Outpatient Medications  ?Medication Sig Dispense Refill  ? Ascorbic Acid (VITAMIN C PO) Take by mouth daily.     ? Celecoxib (CELEBREX PO) Take by mouth.    ? Cholecalciferol (VITAMIN D PO) Take by mouth daily.     ? escitalopram (LEXAPRO) 10 MG tablet 1 tablet po qd 90 tablet 0  ? Esomeprazole Magnesium (NEXIUM PO) Take by mouth.    ? estradiol (VIVELLE-DOT) 0.0375 MG/24HR Place 1 patch onto the skin 2 (two) times a week. 8 patch 0  ? Eszopiclone 3 MG TABS     ? progesterone (PROMETRIUM) 100 MG capsule Take 1 capsule (100 mg total) by mouth daily. 90 capsule 3  ? ?No current facility-administered medications for this visit.  ? ? ?Family History  ?Problem Relation Age of Onset  ? Colon cancer Maternal Grandmother   ? Breast cancer Sister   ?     Half Sister  ? Colon polyps Neg Hx   ? Esophageal cancer Neg Hx   ? Rectal cancer Neg Hx   ? Stomach cancer Neg Hx   ? ? ?Review of Systems  ?All other systems reviewed and are negative. ? ?Exam:   ?BP 124/82   Pulse 73  Ht '4\' 11"'$  (1.499 m)   Wt 157 lb (71.2 kg)   LMP 09/21/2001   SpO2 97%   BMI 31.71 kg/m?   Weight change: '@WEIGHTCHANGE'$ @ Height:   Height: '4\' 11"'$  (149.9 cm)  ?Ht Readings from Last 3 Encounters:  ?06/24/21 '4\' 11"'$  (1.499 m)  ?06/22/20 4' 11.5" (1.511 m)  ?06/05/20 4' 11.5" (1.511 m)  ? ? ?General appearance: alert, cooperative and appears stated age ?Head: Normocephalic, without obvious abnormality, atraumatic ?Neck: no adenopathy, supple, symmetrical, trachea midline and thyroid normal to inspection and palpation ?Lungs: clear to auscultation bilaterally ?Cardiovascular: regular rate and rhythm ?Breasts: normal appearance, no masses or tenderness ?Abdomen: soft, non-tender; non distended,  no masses,  no organomegaly ?Extremities: extremities normal, atraumatic, no cyanosis or edema ?Skin: Skin color, texture, turgor normal. No rashes or lesions ?Lymph nodes: Cervical, supraclavicular, and axillary nodes  normal. ?No abnormal inguinal nodes palpated ?Neurologic: Grossly normal ? ? ?Pelvic: External genitalia:  no lesions ?             Urethra:  normal appearing urethra with no masses, tenderness or lesions ?             Bartholins and Skenes: normal    ?             Vagina: normal appearing vagina with normal color and discharge, no lesions ?             Cervix: no lesions ?              ?Bimanual Exam:  Uterus:  normal size, contour, position, consistency, mobility, non-tender ?             Adnexa: no mass, fullness, tenderness ?              Rectovaginal: Confirms ?              Anus:  normal sphincter tone, no lesions ? ?Gae Dry chaperoned for the exam. ? ?1. Encounter for breast and pelvic examination ?Discussed breast self exam ?Discussed calcium and vit D intake ?Mammogram, colonoscopy and dexa are utd ?Labs with primary ? ?2. Urge incontinence ?Discussed kegels, avoiding caffeine, option of medication ?- Urinalysis,Complete w/RFL Culture ? ?3. Hormone replacement therapy ?She is willing to decrease her estrogen dose.  ?- progesterone (PROMETRIUM) 100 MG capsule; Take 1 capsule (100 mg total) by mouth daily.  Dispense: 90 capsule; Refill: 3 ?- estradiol (VIVELLE-DOT) 0.025 MG/24HR; Place 1 patch onto the skin 2 (two) times a week.  Dispense: 24 patch; Refill: 3 ? ?

## 2021-06-18 ENCOUNTER — Other Ambulatory Visit: Payer: Self-pay

## 2021-06-18 DIAGNOSIS — Z79890 Hormone replacement therapy (postmenopausal): Secondary | ICD-10-CM

## 2021-06-18 MED ORDER — ESTRADIOL 0.0375 MG/24HR TD PTTW: 1.0000 | MEDICATED_PATCH | TRANSDERMAL | 0 refills | Status: DC

## 2021-06-18 NOTE — Telephone Encounter (Signed)
Last AEX 06/05/20--scheduled for 06/24/21.  ?Last mammo- 03/14/21 ?

## 2021-06-24 ENCOUNTER — Ambulatory Visit (INDEPENDENT_AMBULATORY_CARE_PROVIDER_SITE_OTHER): Payer: PPO | Admitting: Obstetrics and Gynecology

## 2021-06-24 ENCOUNTER — Encounter: Payer: Self-pay | Admitting: Obstetrics and Gynecology

## 2021-06-24 VITALS — BP 124/82 | HR 73 | Ht 59.0 in | Wt 157.0 lb

## 2021-06-24 DIAGNOSIS — N3941 Urge incontinence: Secondary | ICD-10-CM

## 2021-06-24 DIAGNOSIS — Z79890 Hormone replacement therapy (postmenopausal): Secondary | ICD-10-CM | POA: Diagnosis not present

## 2021-06-24 DIAGNOSIS — Z01419 Encounter for gynecological examination (general) (routine) without abnormal findings: Secondary | ICD-10-CM | POA: Diagnosis not present

## 2021-06-24 LAB — URINALYSIS, COMPLETE W/RFL CULTURE
Bacteria, UA: NONE SEEN /HPF
Bilirubin Urine: NEGATIVE
Casts: NONE SEEN /LPF
Crystals: NONE SEEN /HPF
Glucose, UA: NEGATIVE
Hgb urine dipstick: NEGATIVE
Hyaline Cast: NONE SEEN /LPF
Ketones, ur: NEGATIVE
Leukocyte Esterase: NEGATIVE
Nitrites, Initial: NEGATIVE
Protein, ur: NEGATIVE
RBC / HPF: NONE SEEN /HPF (ref 0–2)
Specific Gravity, Urine: 1.01 (ref 1.001–1.035)
WBC, UA: NONE SEEN /HPF (ref 0–5)
Yeast: NONE SEEN /HPF
pH: 5.5 (ref 5.0–8.0)

## 2021-06-24 LAB — NO CULTURE INDICATED

## 2021-06-24 MED ORDER — PROGESTERONE MICRONIZED 100 MG PO CAPS: 100.0000 mg | ORAL_CAPSULE | Freq: Every day | ORAL | 3 refills | Status: DC

## 2021-06-24 MED ORDER — ESTRADIOL 0.025 MG/24HR TD PTTW: 1.0000 | MEDICATED_PATCH | TRANSDERMAL | 3 refills | Status: DC

## 2021-06-24 NOTE — Patient Instructions (Addendum)
Try metamucil for bowel movements.  ? ?Urinary Incontinence ?Urinary incontinence refers to a condition in which a person is unable to control where and when to pass urine. A person with this condition will urinate involuntarily. This means that the person urinates when he or she does not mean to. ?What are the causes? ?This condition may be caused by: ?Medicines. ?Infections. ?Constipation. ?Overactive bladder muscles. ?Weak bladder muscles. ?Weak pelvic floor muscles. These muscles provide support for the bladder, intestine, and, in women, the uterus. ?Enlarged prostate in men. The prostate is a gland near the bladder. When it gets too big, it can pinch the urethra. With the urethra blocked, the bladder can weaken and lose the ability to empty properly. ?Surgery. ?Emotional factors, such as anxiety, stress, or post-traumatic stress disorder (PTSD). ?Spinal cord injury, nerve injury, or other neurological conditions. ?Pelvic organ prolapse. This happens in women when organs move out of place and into the vagina. This movement can prevent the bladder and urethra from working properly. ?What increases the risk? ?The following factors may make you more likely to develop this condition: ?Age. The older you are, the higher the risk. ?Obesity. ?Being physically inactive. ?Pregnancy and childbirth. ?Menopause. ?Diseases that affect the nerves or spinal cord. ?Long-term, or chronic, coughing. This can increase pressure on the bladder and pelvic floor muscles. ?What are the signs or symptoms? ?Symptoms may vary depending on the type of urinary incontinence you have. They include: ?A sudden urge to urinate, and passing urine involuntarily before you can get to a bathroom (urge incontinence). ?Suddenly passing urine when doing activities that force urine to pass, such as coughing, laughing, exercising, or sneezing (stress incontinence). ?Needing to urinate often but urinating only a small amount, or constantly dribbling urine  (overflow incontinence). ?Urinating because you cannot get to the bathroom in time due to a physical disability, such as arthritis or injury, or due to a communication or thinking problem, such as Alzheimer's disease (functional incontinence). ?How is this diagnosed? ?This condition may be diagnosed based on: ?Your medical history. ?A physical exam. ?Tests, such as: ?Urine tests. ?X-rays of your kidney and bladder. ?Ultrasound. ?CT scan. ?Cystoscopy. In this procedure, a health care provider inserts a tube with a light and camera (cystoscope) through the urethra and into the bladder to check for problems. ?Urodynamic testing. These tests assess how well the bladder, urethra, and sphincter can store and release urine. There are different types of urodynamic tests, and they vary depending on what the test is measuring. ?To help diagnose your condition, your health care provider may recommend that you keep a log of when you urinate and how much you urinate. ?How is this treated? ?Treatment for this condition depends on the type of incontinence that you have and its cause. Treatment may include: ?Lifestyle changes, such as: ?Quitting smoking. ?Maintaining a healthy weight. ?Staying active. Try to get 150 minutes of moderate-intensity exercise every week. Ask your health care provider which activities are safe for you. ?Eating a healthy diet. ?Avoid high-fat foods, like fried foods. ?Avoid refined carbohydrates like white bread and white rice. ?Limit how much alcohol and caffeine you drink. ?Increase your fiber intake. Healthy sources of fiber include beans, whole grains, and fresh fruits and vegetables. ?Behavioral changes, such as: ?Pelvic floor muscle exercises. ?Bladder training, such as lengthening the amount of time between bathroom breaks, or using the bathroom at regular intervals. ?Using techniques to suppress bladder urges. This can include distraction techniques or controlled breathing exercises. ?Medicines,  such as: ?Medicines to relax the bladder muscles and prevent bladder spasms. ?Medicines to help slow or prevent the growth of a man's prostate. ?Botox injections. These can help relax the bladder muscles. ?Treatments, such as: ?Using pulses of electricity to help change bladder reflexes (electrical nerve stimulation). ?For women, using a medical device to prevent urine leaks. This is a small, tampon-like, disposable device that is inserted into the urethra. ?Injecting collagen or carbon beads (bulking agents) into the urinary sphincter. These can help thicken tissue and close the bladder opening. ?Surgery. ?Follow these instructions at home: ?Lifestyle ?Limit alcohol and caffeine. These can fill your bladder quickly and irritate it. ?Keep yourself clean to help prevent odors and skin damage. Ask your health care provider about special skin creams and cleansers that can protect the skin from urine. ?Consider wearing pads or adult diapers. Make sure to change them regularly, and always change them right after experiencing incontinence. ?General instructions ?Take over-the-counter and prescription medicines only as told by your health care provider. ?Use the bathroom about every 3-4 hours, even if you do not feel the need to urinate. Try to empty your bladder completely every time. After urinating, wait a minute. Then try to urinate again. ?Make sure you are in a relaxed position while urinating. ?If your incontinence is caused by nerve problems, keep a log of the medicines you take and the times you go to the bathroom. ?Keep all follow-up visits. This is important. ?Where to find more information ?[employer] of Diabetes and Digestive and Kidney Diseases: [employer] ?American Urology Association: www.urologyhealth.org ?Contact a health care provider if: ?You have pain that gets worse. ?Your incontinence gets worse. ?Get help right away if: ?You have a fever or chills. ?You are unable to urinate. ?You  have redness in your groin area or down your legs. ?Summary ?Urinary incontinence refers to a condition in which a person is unable to control where and when to pass urine. ?This condition may be caused by medicines, infection, weak bladder muscles, weak pelvic floor muscles, enlargement of the prostate (in men), or surgery. ?Factors such as older age, obesity, pregnancy and childbirth, menopause, neurological diseases, and chronic coughing may increase your risk for developing this condition. ?Types of urinary incontinence include urge incontinence, stress incontinence, overflow incontinence, and functional incontinence. ?This condition is usually treated first with lifestyle and behavioral changes, such as quitting smoking, eating a healthier diet, and doing regular pelvic floor exercises. Other treatment options include medicines, bulking agents, medical devices, electrical nerve stimulation, or surgery. ?This information is not intended to replace advice given to you by your health care provider. Make sure you discuss any questions you have with your health care provider. ?Document Revised: 09/02/2019 Document Reviewed: 09/02/2019 ?Elsevier Patient Education ? [place]. ?Kegel Exercises ? ?Kegel exercises can help strengthen your pelvic floor muscles. The pelvic floor is a group of muscles that support your rectum, small intestine, and bladder. In females, pelvic floor muscles also help support the uterus. These muscles help you control the flow of urine and stool (feces). ?Kegel exercises are painless and simple. They do not require any equipment. Your provider may suggest Kegel exercises to: ?Improve bladder and bowel control. ?Improve sexual response. ?Improve weak pelvic floor muscles after surgery to remove the uterus (hysterectomy) or after pregnancy, in females. ?Improve weak pelvic floor muscles after prostate gland removal or surgery, in males. ?Kegel exercises involve squeezing your pelvic  floor muscles. These are the same muscles you squeeze when  you try to stop the flow of urine or keep from passing gas. The exercises can be done while sitting, standing, or lying down, but it is best to var

## 2021-08-12 ENCOUNTER — Telehealth: Payer: Self-pay

## 2021-08-12 MED ORDER — ESTRADIOL 0.0375 MG/24HR TD PTTW
1.0000 | MEDICATED_PATCH | TRANSDERMAL | 3 refills | Status: DC
Start: 2021-08-12 — End: 2022-09-17

## 2021-08-12 NOTE — Telephone Encounter (Signed)
Spoke with patient and informed her. °

## 2021-08-12 NOTE — Telephone Encounter (Signed)
Please let her know that I sent in the higher dose patch for her.

## 2021-08-12 NOTE — Telephone Encounter (Addendum)
At 06/24/2021 AEX estradiol patch dose was decreased from 0.'0375mg'$  to 0.'025mg'$ .  Patient called complaining of hotflashes and would like to back on the 0.0375 mg patch she was on previously.

## 2021-09-17 ENCOUNTER — Other Ambulatory Visit: Payer: Self-pay | Admitting: Obstetrics and Gynecology

## 2021-09-17 DIAGNOSIS — F32A Depression, unspecified: Secondary | ICD-10-CM

## 2021-09-17 NOTE — Telephone Encounter (Signed)
Annual exam was 06/24/21

## 2021-11-25 DIAGNOSIS — K219 Gastro-esophageal reflux disease without esophagitis: Secondary | ICD-10-CM | POA: Diagnosis not present

## 2021-11-25 DIAGNOSIS — Z1152 Encounter for screening for COVID-19: Secondary | ICD-10-CM | POA: Diagnosis not present

## 2021-11-25 DIAGNOSIS — R059 Cough, unspecified: Secondary | ICD-10-CM | POA: Diagnosis not present

## 2021-11-25 DIAGNOSIS — J029 Acute pharyngitis, unspecified: Secondary | ICD-10-CM | POA: Diagnosis not present

## 2021-11-25 DIAGNOSIS — R5383 Other fatigue: Secondary | ICD-10-CM | POA: Diagnosis not present

## 2021-11-25 DIAGNOSIS — J302 Other seasonal allergic rhinitis: Secondary | ICD-10-CM | POA: Diagnosis not present

## 2021-12-19 DIAGNOSIS — R7989 Other specified abnormal findings of blood chemistry: Secondary | ICD-10-CM | POA: Diagnosis not present

## 2021-12-19 DIAGNOSIS — E785 Hyperlipidemia, unspecified: Secondary | ICD-10-CM | POA: Diagnosis not present

## 2021-12-19 DIAGNOSIS — R946 Abnormal results of thyroid function studies: Secondary | ICD-10-CM | POA: Diagnosis not present

## 2021-12-26 DIAGNOSIS — R82998 Other abnormal findings in urine: Secondary | ICD-10-CM | POA: Diagnosis not present

## 2021-12-26 DIAGNOSIS — Z1331 Encounter for screening for depression: Secondary | ICD-10-CM | POA: Diagnosis not present

## 2021-12-26 DIAGNOSIS — Z1389 Encounter for screening for other disorder: Secondary | ICD-10-CM | POA: Diagnosis not present

## 2021-12-26 DIAGNOSIS — E785 Hyperlipidemia, unspecified: Secondary | ICD-10-CM | POA: Diagnosis not present

## 2021-12-26 DIAGNOSIS — Z23 Encounter for immunization: Secondary | ICD-10-CM | POA: Diagnosis not present

## 2021-12-26 DIAGNOSIS — R002 Palpitations: Secondary | ICD-10-CM | POA: Diagnosis not present

## 2021-12-26 DIAGNOSIS — Z0000 Encounter for general adult medical examination without abnormal findings: Secondary | ICD-10-CM | POA: Diagnosis not present

## 2021-12-26 DIAGNOSIS — F325 Major depressive disorder, single episode, in full remission: Secondary | ICD-10-CM | POA: Diagnosis not present

## 2021-12-26 DIAGNOSIS — Z79890 Hormone replacement therapy (postmenopausal): Secondary | ICD-10-CM | POA: Diagnosis not present

## 2021-12-26 DIAGNOSIS — R030 Elevated blood-pressure reading, without diagnosis of hypertension: Secondary | ICD-10-CM | POA: Diagnosis not present

## 2021-12-26 DIAGNOSIS — I77810 Thoracic aortic ectasia: Secondary | ICD-10-CM | POA: Diagnosis not present

## 2021-12-26 DIAGNOSIS — G4700 Insomnia, unspecified: Secondary | ICD-10-CM | POA: Diagnosis not present

## 2021-12-26 DIAGNOSIS — Z1212 Encounter for screening for malignant neoplasm of rectum: Secondary | ICD-10-CM | POA: Diagnosis not present

## 2021-12-26 DIAGNOSIS — R059 Cough, unspecified: Secondary | ICD-10-CM | POA: Diagnosis not present

## 2022-02-21 ENCOUNTER — Other Ambulatory Visit: Payer: Self-pay | Admitting: Internal Medicine

## 2022-02-21 DIAGNOSIS — Z1231 Encounter for screening mammogram for malignant neoplasm of breast: Secondary | ICD-10-CM

## 2022-03-04 IMAGING — MG MM DIGITAL DIAGNOSTIC UNILAT*R* W/ TOMO W/ CAD
4 series · 4 of 12 positions shown · non-contrast
Comparison: Previous exam(s).

CLINICAL DATA: Possible asymmetry in the anterior aspect of the
lower outer right breast on a recent screening mammogram.

EXAM:
DIGITAL DIAGNOSTIC RIGHT MAMMOGRAM WITH TOMO

[R CC synth-2D]
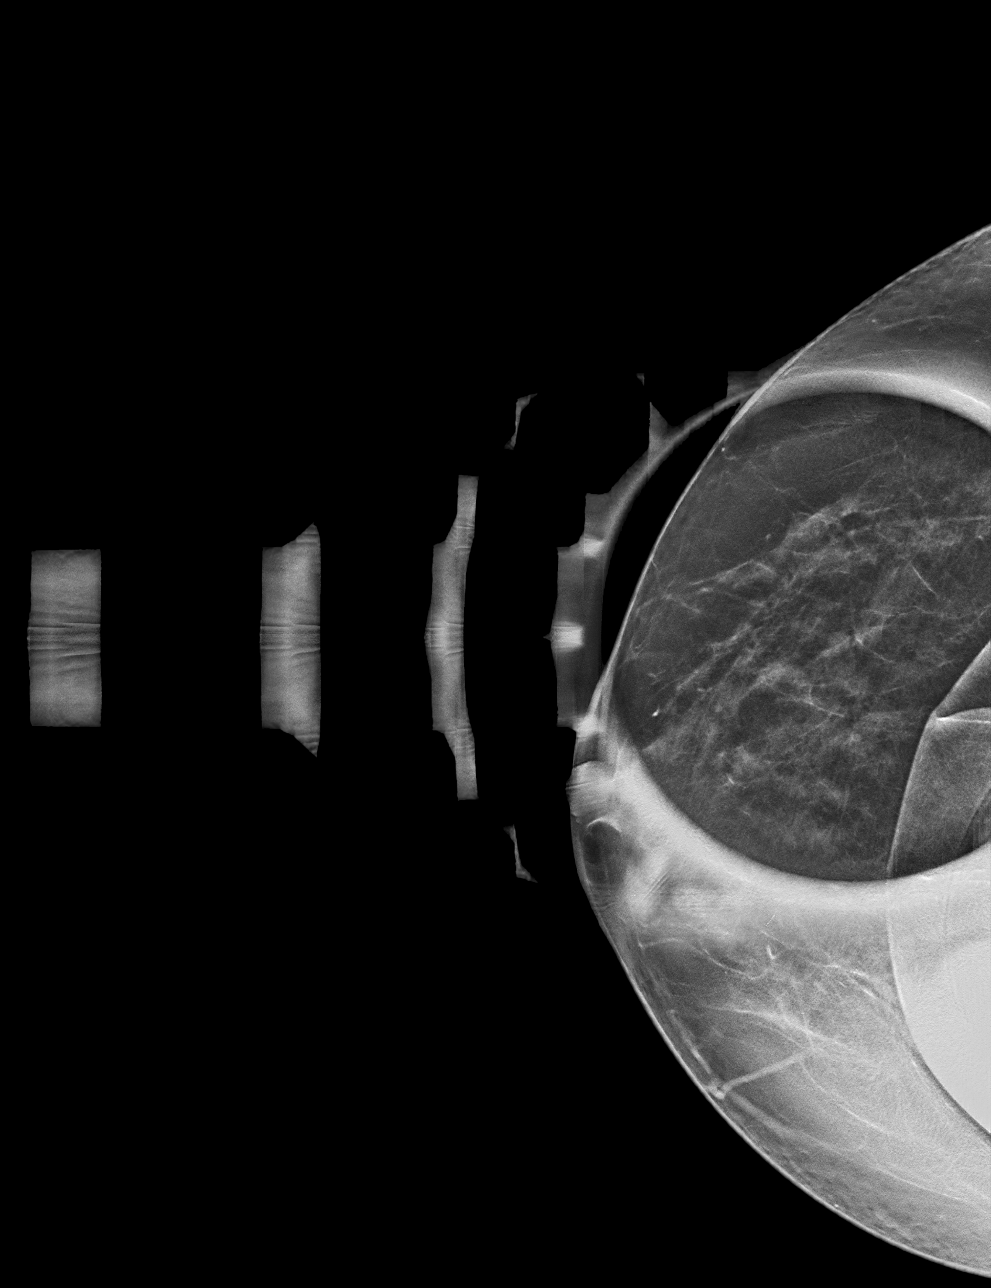

[R MLO synth-2D]
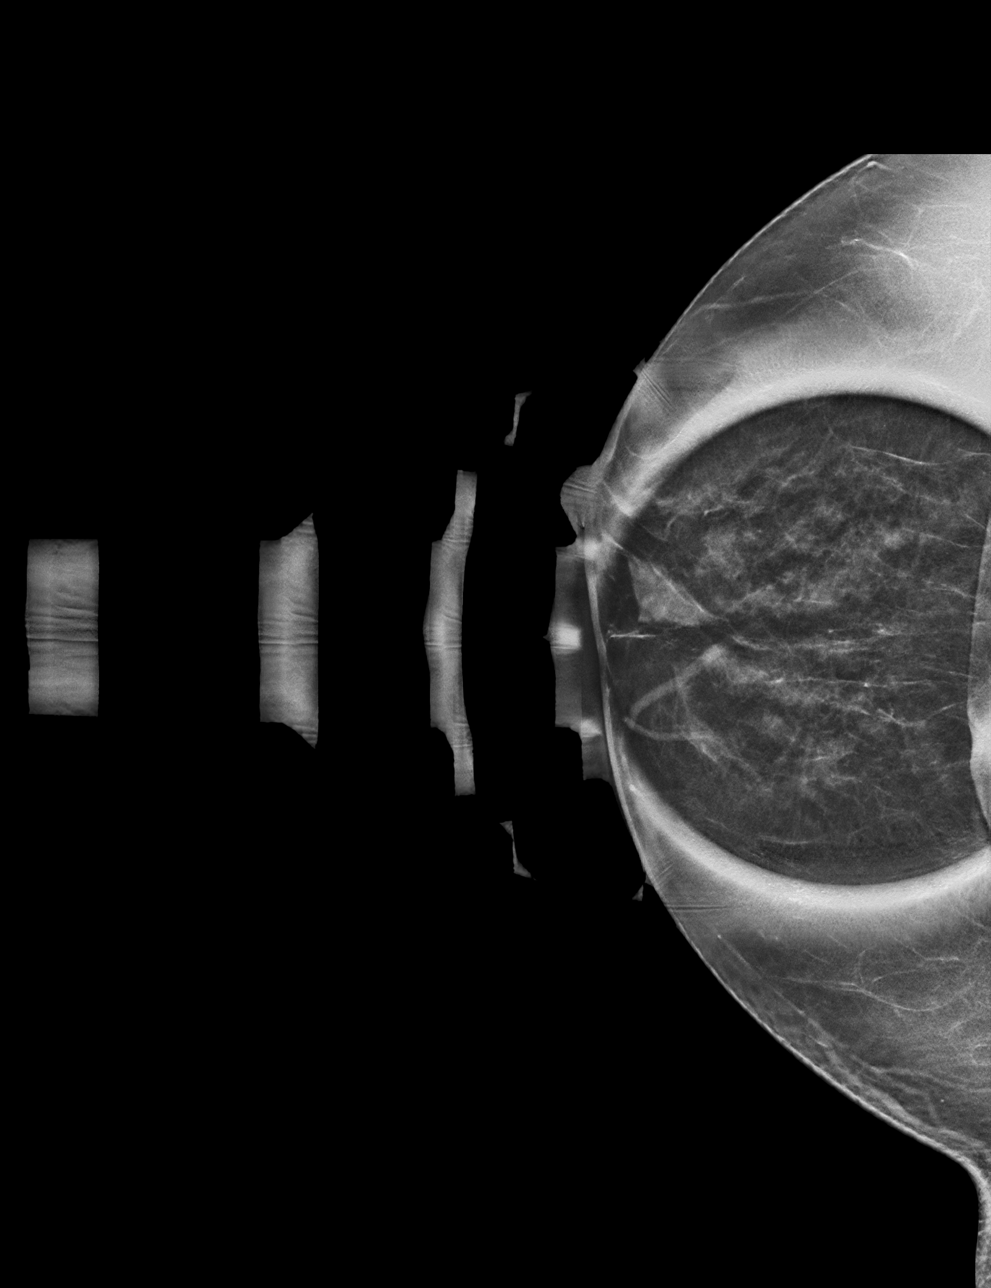

[R CC tomo · tomo slice 21/40.0]
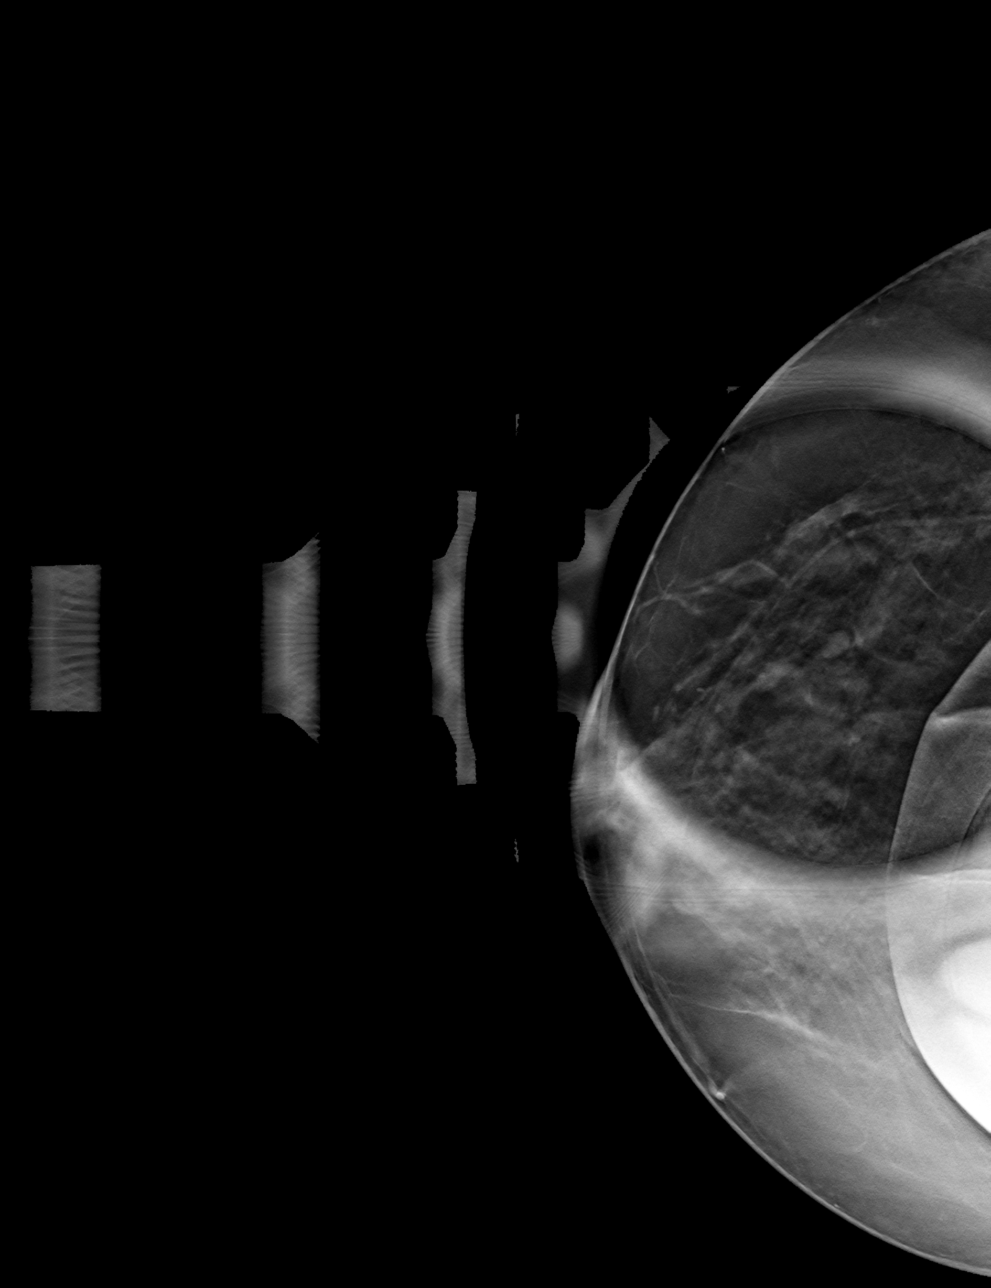

[R MLO tomo · tomo slice 21/41.0]
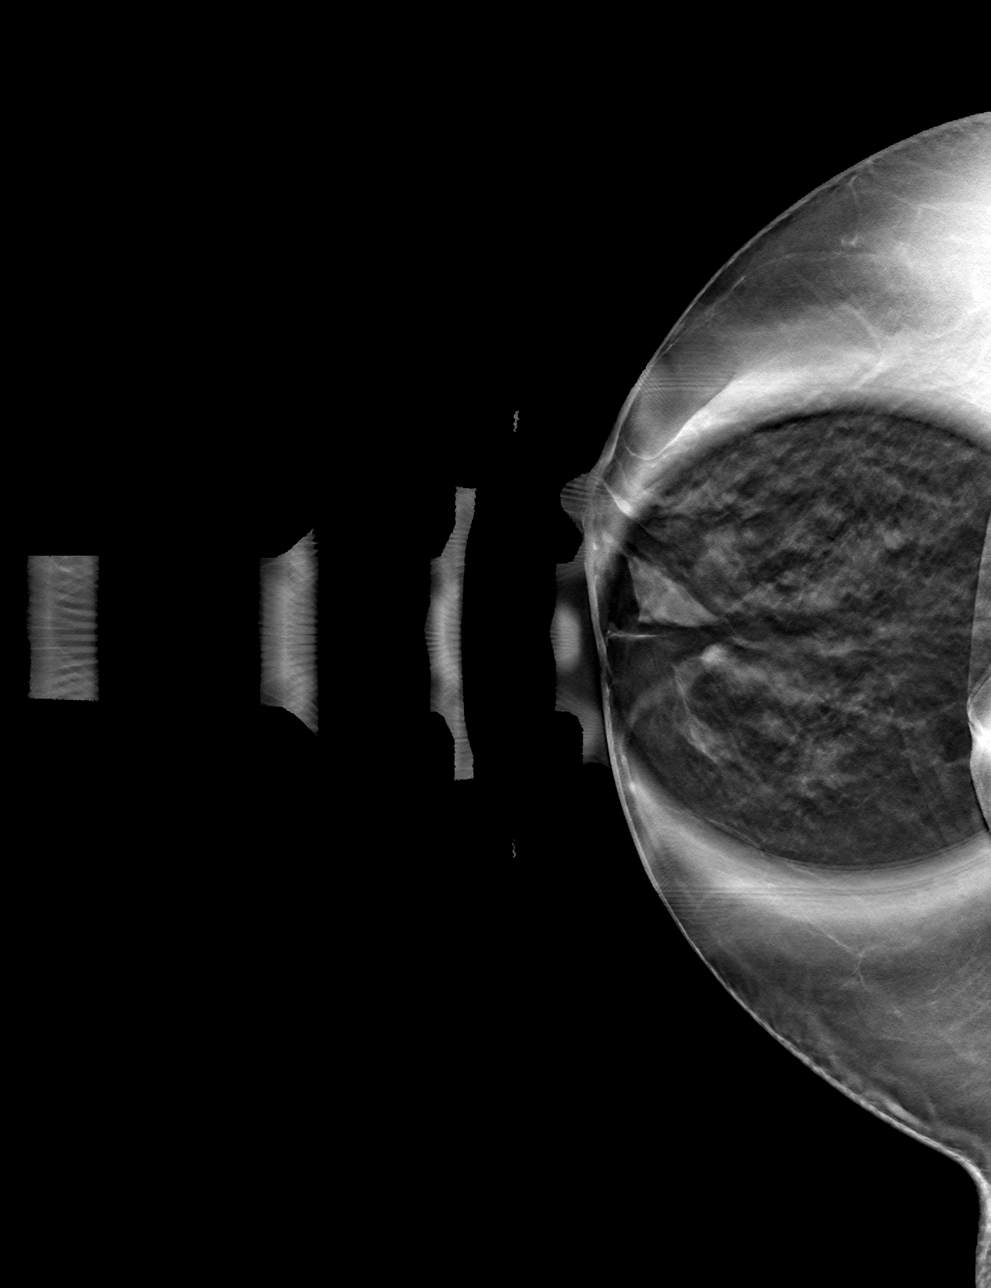

[4 of 12 positions shown; findings below may reference images not displayed]

ACR Breast Density Category c: The breast tissue is heterogeneously
dense, which may obscure small masses.
FINDINGS: 3D tomographic and 2D generated spot compression implant displaced
images of the right breast were obtained. These demonstrate normal
appearing fibroglandular tissue at the location of the recently
suspected asymmetry, unchanged compared to previous examinations.
IMPRESSION: No evidence of malignancy. The recently suspected right breast
asymmetry was close apposition of normal breast tissue.

RECOMMENDATION:
Bilateral screening mammogram in 1 year.

I have discussed the findings and recommendations with the patient.
If applicable, a reminder letter will be sent to the patient
regarding the next appointment.

BI-RADS CATEGORY  1: Negative.

## 2022-03-06 ENCOUNTER — Ambulatory Visit: Payer: PPO

## 2022-04-18 ENCOUNTER — Ambulatory Visit
Admission: RE | Admit: 2022-04-18 | Discharge: 2022-04-18 | Disposition: A | Discharge status: Home / Self Care | Payer: PPO | Source: Ambulatory Visit | Attending: Internal Medicine | Admitting: Internal Medicine

## 2022-04-18 DIAGNOSIS — Z1231 Encounter for screening mammogram for malignant neoplasm of breast: Secondary | ICD-10-CM

## 2022-06-30 ENCOUNTER — Other Ambulatory Visit: Payer: Self-pay | Admitting: Obstetrics and Gynecology

## 2022-06-30 DIAGNOSIS — Z79890 Hormone replacement therapy (postmenopausal): Secondary | ICD-10-CM

## 2022-06-30 NOTE — Telephone Encounter (Signed)
Med refill request: Prometrium  Last AEX: 06/24/21 Next AEX: not scheduled Last MMG (if hormonal med) 01/12/20  Refill authorized: Please Advise?

## 2022-07-01 NOTE — Telephone Encounter (Signed)
A 3 month supply of Prometrium has been sent. Please have her schedule a med check. B&P not due until next May.

## 2022-09-17 ENCOUNTER — Other Ambulatory Visit: Payer: Self-pay

## 2022-09-17 DIAGNOSIS — F32A Depression, unspecified: Secondary | ICD-10-CM

## 2022-09-17 MED ORDER — ESCITALOPRAM OXALATE 10 MG PO TABS: ORAL_TABLET | ORAL | 0 refills | Status: DC

## 2022-09-17 MED ORDER — ESTRADIOL 0.0375 MG/24HR TD PTTW: 1.0000 | MEDICATED_PATCH | TRANSDERMAL | 0 refills | Status: DC

## 2022-09-17 NOTE — Telephone Encounter (Signed)
Former JJ pt LVM in triage line stating that her [facility]) advised her that they have been trying x1 week to request refills on her HRT patches. Requesting response on this today/asap.

## 2022-09-17 NOTE — Telephone Encounter (Signed)
Medication refill request: vivelle dot patch 0.0375mg  Last AEX:  06-24-21 Next AEX: not scheduled Last MMG (if hormonal medication request): 04-18-22 category c density birads 1:neg Refill authorized: message sent to scheduling department to schedule patient for 62yr medication follow up & she do her breast & pelvic 5/25. Please approve if appropriate

## 2022-09-17 NOTE — Telephone Encounter (Signed)
Medication refill request: escitalopram 10mg  Last AEX:  06-24-21 Next AEX: 10-16-22 for 60yr medication f/u Last MMG (if hormonal medication request): n/a Refill authorized: please approve if appropriate

## 2022-09-17 NOTE — Telephone Encounter (Signed)
Rx's sent to pharmacy & patient is aware. She also scheduled her 53yr medication follow up

## 2022-09-18 DIAGNOSIS — M25562 Pain in left knee: Secondary | ICD-10-CM | POA: Diagnosis not present

## 2022-09-25 ENCOUNTER — Other Ambulatory Visit: Payer: Self-pay | Admitting: *Deleted

## 2022-09-25 DIAGNOSIS — Z79890 Hormone replacement therapy (postmenopausal): Secondary | ICD-10-CM

## 2022-09-25 MED ORDER — PROGESTERONE MICRONIZED 100 MG PO CAPS: 100.0000 mg | ORAL_CAPSULE | Freq: Every day | ORAL | 0 refills | Status: DC

## 2022-09-25 NOTE — Telephone Encounter (Signed)
Med refill request: Progesterone 100 mg PO daily Last AEX: 06/24/21 -JJ Next AEX: 10/16/22 -JC Last MMG (if hormonal med) 04/18/22, BiRads 1 neg  Last RX sent 07/01/22 for #90/0RF  Refill authorized: Please Advise?

## 2022-10-16 ENCOUNTER — Ambulatory Visit: Payer: PPO | Admitting: Radiology

## 2022-10-16 ENCOUNTER — Encounter: Payer: Self-pay | Admitting: Radiology

## 2022-10-16 VITALS — BP 122/74 | Ht 59.0 in | Wt 158.0 lb

## 2022-10-16 DIAGNOSIS — Z9189 Other specified personal risk factors, not elsewhere classified: Secondary | ICD-10-CM

## 2022-10-16 DIAGNOSIS — Z79890 Hormone replacement therapy (postmenopausal): Secondary | ICD-10-CM | POA: Diagnosis not present

## 2022-10-16 DIAGNOSIS — Z01419 Encounter for gynecological examination (general) (routine) without abnormal findings: Secondary | ICD-10-CM

## 2022-10-16 MED ORDER — PROGESTERONE MICRONIZED 100 MG PO CAPS
100.0000 mg | ORAL_CAPSULE | Freq: Every day | ORAL | 4 refills | Status: DC
Start: 2022-10-16 — End: 2023-12-03

## 2022-10-16 MED ORDER — ESTRADIOL 0.0375 MG/24HR TD PTTW
1.0000 | MEDICATED_PATCH | TRANSDERMAL | 4 refills | Status: DC
Start: 2022-10-16 — End: 2023-10-26

## 2022-10-16 NOTE — Progress Notes (Signed)
   Nina Martinez [birth year] [id number]   History: Postmenopausal 72 y.o. presents for medicare breast and pelvic exam. Doing well, no concerns. Denies any bleeding. No pain with intercourse. She denies any problems with HRT, would like to continue, aware of any associated risks.   Gynecologic History Postmenopausal Last Pap: 2018. Results were: normal, paps d/c'd Last mammogram: 3/24. Results were: normal HRT use: current, no concerns  Obstetric History OB History  Gravida Para Term Preterm AB Living  3 2 2   1 2   SAB IAB Ectopic Multiple Live Births  1            # Outcome Date GA Lbr Len/2nd Weight Sex Type Anes PTL Lv  3 SAB           2 Term           1 Term              The following portions of the patient's history were reviewed and updated as appropriate: allergies, current medications, past family history, past medical history, past social history, past surgical history, and problem list.  Review of Systems Pertinent items noted in HPI and remainder of comprehensive ROS otherwise negative.  Past medical history, past surgical history, family history and social history were all reviewed and documented in the EPIC chart.  Exam:  Vitals:   10/16/22 1406  BP: 122/74  Weight: 158 lb (71.7 kg)  Height: 4\' 11"  (1.499 m)   Body mass index is 31.91 kg/m.  General appearance:  Normal Abdominal  Soft,nontender, without masses, guarding or rebound.  Liver/spleen:  No organomegaly noted  Hernia:  None appreciated  Skin  Inspection:  Grossly normal Breasts: Examined lying and sitting.   Right: Without masses, retractions, nipple discharge or axillary adenopathy.   Left: Without masses, retractions, nipple discharge or axillary adenopathy. Genitourinary   Inguinal/mons:  Normal without inguinal adenopathy  External genitalia:  Normal appearing vulva with no masses, tenderness, or lesions  BUS/Urethra/Skene's glands:  Normal  Vagina:  Normal appearing with normal  color and discharge, no lesions. Atrophy: mild   Cervix:  Normal appearing without discharge or lesions  Uterus:  Normal in size, shape and contour.  Midline and mobile, nontender  Adnexa/parametria:     Rt: Normal in size, without masses or tenderness.   Lt: Normal in size, without masses or tenderness.  Anus and perineum: Normal    Kristin Bruins, CMA present for exam  Assessment/Plan:   1. Well woman exam with routine gynecological exam Up to date on screenings  2. Hormone replacement therapy Risks and benefits again reviewed - estradiol (VIVELLE-DOT) 0.0375 MG/24HR; Place 1 patch onto the skin 2 (two) times a week.  Dispense: 24 patch; Refill: 4 - progesterone (PROMETRIUM) 100 MG capsule; Take 1 capsule (100 mg total) by mouth daily.  Dispense: 90 capsule; Refill: 4    Discussed SBE, colonoscopy and DEXA screening as directed. Recommend of exercise weekly, including weight bearing exercise. Encouraged the use of seatbelts and sunscreen.  Return in 1 year for annual or sooner prn.  Tanda Rockers WHNP-BC, 2:37 PM 10/16/2022

## 2022-11-21 DIAGNOSIS — M19072 Primary osteoarthritis, left ankle and foot: Secondary | ICD-10-CM | POA: Diagnosis not present

## 2022-12-18 ENCOUNTER — Other Ambulatory Visit: Payer: Self-pay

## 2022-12-18 DIAGNOSIS — F32A Depression, unspecified: Secondary | ICD-10-CM

## 2022-12-18 MED ORDER — ESCITALOPRAM OXALATE 10 MG PO TABS
ORAL_TABLET | ORAL | 3 refills | Status: DC
Start: 2022-12-18 — End: 2023-12-14

## 2022-12-18 NOTE — Telephone Encounter (Signed)
Med refill request: escitalopram 10 mg #90 Last AEX: 10/16/22 Next AEX: n/a Last MMG (if hormonal med) n/a Refill authorized: escitalopram 10 mg #90 with 3 refills.  Sent to provider for review.

## 2022-12-25 DIAGNOSIS — Z79899 Other long term (current) drug therapy: Secondary | ICD-10-CM | POA: Diagnosis not present

## 2022-12-25 DIAGNOSIS — Z1212 Encounter for screening for malignant neoplasm of rectum: Secondary | ICD-10-CM | POA: Diagnosis not present

## 2022-12-25 DIAGNOSIS — R946 Abnormal results of thyroid function studies: Secondary | ICD-10-CM | POA: Diagnosis not present

## 2022-12-25 DIAGNOSIS — R7989 Other specified abnormal findings of blood chemistry: Secondary | ICD-10-CM | POA: Diagnosis not present

## 2022-12-25 DIAGNOSIS — E785 Hyperlipidemia, unspecified: Secondary | ICD-10-CM | POA: Diagnosis not present

## 2023-01-01 DIAGNOSIS — R030 Elevated blood-pressure reading, without diagnosis of hypertension: Secondary | ICD-10-CM | POA: Diagnosis not present

## 2023-01-01 DIAGNOSIS — G4700 Insomnia, unspecified: Secondary | ICD-10-CM | POA: Diagnosis not present

## 2023-01-01 DIAGNOSIS — Z0000 Encounter for general adult medical examination without abnormal findings: Secondary | ICD-10-CM | POA: Diagnosis not present

## 2023-01-01 DIAGNOSIS — N898 Other specified noninflammatory disorders of vagina: Secondary | ICD-10-CM | POA: Diagnosis not present

## 2023-01-01 DIAGNOSIS — I77810 Thoracic aortic ectasia: Secondary | ICD-10-CM | POA: Diagnosis not present

## 2023-01-01 DIAGNOSIS — R82998 Other abnormal findings in urine: Secondary | ICD-10-CM | POA: Diagnosis not present

## 2023-01-01 DIAGNOSIS — F325 Major depressive disorder, single episode, in full remission: Secondary | ICD-10-CM | POA: Diagnosis not present

## 2023-01-01 DIAGNOSIS — M19072 Primary osteoarthritis, left ankle and foot: Secondary | ICD-10-CM | POA: Diagnosis not present

## 2023-01-01 DIAGNOSIS — G4733 Obstructive sleep apnea (adult) (pediatric): Secondary | ICD-10-CM | POA: Diagnosis not present

## 2023-01-01 DIAGNOSIS — M25559 Pain in unspecified hip: Secondary | ICD-10-CM | POA: Diagnosis not present

## 2023-01-01 DIAGNOSIS — M549 Dorsalgia, unspecified: Secondary | ICD-10-CM | POA: Diagnosis not present

## 2023-01-01 DIAGNOSIS — B351 Tinea unguium: Secondary | ICD-10-CM | POA: Diagnosis not present

## 2023-01-01 DIAGNOSIS — E785 Hyperlipidemia, unspecified: Secondary | ICD-10-CM | POA: Diagnosis not present

## 2023-01-01 DIAGNOSIS — M79672 Pain in left foot: Secondary | ICD-10-CM | POA: Diagnosis not present

## 2023-03-31 ENCOUNTER — Other Ambulatory Visit: Payer: Self-pay | Admitting: Internal Medicine

## 2023-03-31 DIAGNOSIS — Z1231 Encounter for screening mammogram for malignant neoplasm of breast: Secondary | ICD-10-CM

## 2023-04-10 DIAGNOSIS — J309 Allergic rhinitis, unspecified: Secondary | ICD-10-CM | POA: Diagnosis not present

## 2023-04-10 DIAGNOSIS — R053 Chronic cough: Secondary | ICD-10-CM | POA: Diagnosis not present

## 2023-04-10 DIAGNOSIS — K219 Gastro-esophageal reflux disease without esophagitis: Secondary | ICD-10-CM | POA: Diagnosis not present

## 2023-04-23 ENCOUNTER — Ambulatory Visit
Admission: RE | Admit: 2023-04-23 | Discharge: 2023-04-23 | Disposition: A | Discharge status: Home / Self Care | Payer: PPO | Source: Ambulatory Visit | Attending: Internal Medicine | Admitting: Internal Medicine

## 2023-04-23 DIAGNOSIS — Z1231 Encounter for screening mammogram for malignant neoplasm of breast: Secondary | ICD-10-CM | POA: Diagnosis not present

## 2023-07-02 ENCOUNTER — Encounter: Payer: PPO | Admitting: Radiology

## 2023-08-03 DIAGNOSIS — H43392 Other vitreous opacities, left eye: Secondary | ICD-10-CM | POA: Diagnosis not present

## 2023-08-25 DIAGNOSIS — E663 Overweight: Secondary | ICD-10-CM | POA: Diagnosis not present

## 2023-08-25 DIAGNOSIS — G4733 Obstructive sleep apnea (adult) (pediatric): Secondary | ICD-10-CM | POA: Diagnosis not present

## 2023-08-25 DIAGNOSIS — E785 Hyperlipidemia, unspecified: Secondary | ICD-10-CM | POA: Diagnosis not present

## 2023-08-25 DIAGNOSIS — R030 Elevated blood-pressure reading, without diagnosis of hypertension: Secondary | ICD-10-CM | POA: Diagnosis not present

## 2023-09-29 ENCOUNTER — Institutional Professional Consult (permissible substitution): Admitting: Neurology

## 2023-10-08 DIAGNOSIS — Z23 Encounter for immunization: Secondary | ICD-10-CM | POA: Diagnosis not present

## 2023-10-08 DIAGNOSIS — R030 Elevated blood-pressure reading, without diagnosis of hypertension: Secondary | ICD-10-CM | POA: Diagnosis not present

## 2023-10-08 DIAGNOSIS — G4733 Obstructive sleep apnea (adult) (pediatric): Secondary | ICD-10-CM | POA: Diagnosis not present

## 2023-10-08 DIAGNOSIS — E669 Obesity, unspecified: Secondary | ICD-10-CM | POA: Diagnosis not present

## 2023-10-08 DIAGNOSIS — E785 Hyperlipidemia, unspecified: Secondary | ICD-10-CM | POA: Diagnosis not present

## 2023-10-20 ENCOUNTER — Encounter: Payer: PPO | Admitting: Radiology

## 2023-10-24 ENCOUNTER — Other Ambulatory Visit: Payer: Self-pay | Admitting: Radiology

## 2023-10-24 DIAGNOSIS — Z79890 Hormone replacement therapy (postmenopausal): Secondary | ICD-10-CM

## 2023-10-26 NOTE — Telephone Encounter (Signed)
.  Med refill request: Estradiol   Last AEX: 10/16/22 Next AEX: 10/29/23 Last MMG (if hormonal med) 04/23/23- BI-RADS CATEGORY 1: Negative.  Refill authorized: Please Advise?

## 2023-10-29 ENCOUNTER — Encounter: Admitting: Radiology

## 2023-10-29 ENCOUNTER — Institutional Professional Consult (permissible substitution): Admitting: Neurology

## 2023-11-12 ENCOUNTER — Ambulatory Visit: Admitting: Neurology

## 2023-11-12 ENCOUNTER — Encounter: Payer: Self-pay | Admitting: Neurology

## 2023-11-12 VITALS — BP 128/86 | HR 68 | Ht 60.0 in | Wt 163.2 lb

## 2023-11-12 DIAGNOSIS — R0681 Apnea, not elsewhere classified: Secondary | ICD-10-CM

## 2023-11-12 DIAGNOSIS — Z6831 Body mass index (BMI) 31.0-31.9, adult: Secondary | ICD-10-CM

## 2023-11-12 DIAGNOSIS — E66811 Obesity, class 1: Secondary | ICD-10-CM | POA: Diagnosis not present

## 2023-11-12 DIAGNOSIS — E6609 Other obesity due to excess calories: Secondary | ICD-10-CM

## 2023-11-12 DIAGNOSIS — K219 Gastro-esophageal reflux disease without esophagitis: Secondary | ICD-10-CM | POA: Diagnosis not present

## 2023-11-12 DIAGNOSIS — R0683 Snoring: Secondary | ICD-10-CM

## 2023-11-12 NOTE — Addendum Note (Signed)
 Addended by: CHALICE SAUNAS on: 11/12/2023 03:57 PM   Modules accepted: Orders

## 2023-11-12 NOTE — Patient Instructions (Addendum)
 Healthy Living: Sleep In this video, you will learn why sleep is an important part of a healthy lifestyle. To view the content, go to this web address: [email or web address].[email or web address]  This video will expire on: 01/21/2025. If you need access to this video following this date, please reach out to the healthcare provider who assigned it to you. This information is not intended to replace advice given to you by your health care provider. Make sure you discuss any questions you have with your health care provider. Elsevier Patient Education  2024 Elsevier Inc.  Quality Sleep Information, Adult Quality sleep is important for your mental and physical health. It also improves your quality of life. Quality sleep means you: Are asleep for most of the time you are in bed. Fall asleep within 30 minutes. Wake up no more than once a night. Are awake for no longer than 20 minutes if you do wake up during the night. Most adults need 7-8 hours of quality sleep each night. How can poor sleep affect me? If you do not get enough quality sleep, you may have: Mood swings. Daytime sleepiness. Decreased alertness, reaction time, and concentration. Sleep disorders, such as insomnia and sleep apnea. Difficulty with: Solving problems. Coping with stress. Paying attention. These issues may affect your performance and productivity at work, school, and home. Lack of sleep may also put you at higher risk for accidents, suicide, and risky behaviors. If you do not get quality sleep, you may also be at higher risk for several health problems, including: Infections. Type 2 diabetes. Heart disease. High blood pressure. Obesity. Worsening of long-term conditions, like arthritis, kidney disease, depression, Parkinson's disease, and epilepsy. What actions can I take to get more quality sleep? Sleep schedule and routine Stick to a sleep schedule. Go to sleep and wake up at about the same time each day. Do not try  to sleep less on weekdays and make up for lost sleep on weekends. This does not work. Limit naps during the day to 30 minutes or less. Do not take naps in the late afternoon. Make time to relax before bed. Reading, listening to music, or taking a hot bath promotes quality sleep. Make your bedroom a place that promotes quality sleep. Keep your bedroom dark, quiet, and at a comfortable room temperature. Make sure your bed is comfortable. Avoid using electronic devices that give off bright blue light for 30 minutes before bedtime. Your brain perceives bright blue light as sunlight. This includes television, phones, and computers. If you are lying awake in bed for longer than 20 minutes, get up and do a relaxing activity until you feel sleepy. Lifestyle     Try to get at least 30 minutes of exercise on most days. Do not exercise 2-3 hours before going to bed. Do not use any products that contain nicotine or tobacco. These products include cigarettes, chewing tobacco, and vaping devices, such as e-cigarettes. If you need help quitting, ask your health care provider. Do not drink caffeinated beverages for at least 8 hours before going to bed. Coffee, tea, and some sodas contain caffeine. Do not drink alcohol  or eat large meals close to bedtime. Try to get at least 30 minutes of sunlight every day. Morning sunlight is best. Medical concerns Work with your health care provider to treat medical conditions that may affect sleeping, such as: Nasal obstruction. Snoring. Sleep apnea and other sleep disorders. Talk to your health care provider if you think any of your prescription medicines may cause  you to have difficulty falling or staying asleep. If you have sleep problems, talk with a sleep consultant. If you think you have a sleep disorder, talk with your health care provider about getting evaluated by a specialist. Where to find more information Sleep Foundation: sleepfoundation.org American Academy  of Sleep Medicine: aasm.org Centers for Disease Control and Prevention (CDC): TonerPromos.no Contact a health care provider if: You have trouble getting to sleep or staying asleep. You often wake up very early in the morning and cannot get back to sleep. You have daytime sleepiness. You have daytime sleep attacks of suddenly falling asleep and sudden muscle weakness (narcolepsy). You have a tingling sensation in your legs with a strong urge to move your legs (restless legs syndrome). You stop breathing briefly during sleep (sleep apnea). You think you have a sleep disorder or are taking a medicine that is affecting your quality of sleep. Summary Most adults need 7-8 hours of quality sleep each night. Getting enough quality sleep is important for your mental and physical health. Make your bedroom a place that promotes quality sleep, and avoid things that may cause you to have poor sleep, such as alcohol , caffeine, smoking, or large meals. Talk to your health care provider if you have trouble falling asleep or staying asleep. This information is not intended to replace advice given to you by your health care provider. Make sure you discuss any questions you have with your health care provider. Document Revised: 05/22/2021 Document Reviewed: 05/22/2021 Elsevier Patient Education  2024 Elsevier Inc.      My Plan is to proceed with:   Either HST or PSG, I will order both .    1)  This visit was directed towards finding out of sleep apnea is present and,  if  present, if the degree of apnea qualifies for the use of a weight loss drug such as Zepbound.  These medication may  still not be covered by health insurance.    I plan to follow up prn ( if apnea is present )personally or  through our NP within 4-6 months.

## 2023-11-12 NOTE — Progress Notes (Signed)
 @[facility]   Provider:  Dedra Gores, MD  Primary Care Physician:  Onita Rush, MD [address] [place] [address]  Referring Provider: Stephane Leita DEL, Md [address] [place],  [place] [address]        Chief Concern for this Consultation:   Patient presents with          HPI: I have the pleasure of meeting with Nina Martinez , on 11-12-2023, who is a 73 y.o.  female patient,  seen upon a referral by Dr Onita  for a new Sleep Medicine Consultation.  The patient's referral information asked for an evaluation based on newly developed HTN, weight gain and inability to lose weight. She also endorsed fatigue. She is interested in medication helping her to loose weight, and we are looking if apnea is present.   Chief concern according to patient:  I could probably do a bit more to loose weight but I am just not having all the energy to excise.  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 I am a mouth      Nina Martinez presented with a medical history of :Allergy, Anemia, Anxiety, Snoring, and GERD (gastroesophageal reflux disease)..right hip pain after replacement. 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   Sleep relevant medical/ surgical and symptom history: The patient reports onset of symptoms over a time period of 12 months seemingly associated with weight gain.       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  2 adult children, 5 grandsons , age 47 through 57.   The workplace involves physical activity, outdoor activity, travel.  Nicotine use: /. Quit over 45 years ago   ETOH use: routine 1 glass of wine, 3 ounces. ,  Caffeine intake in form of: Coffee (2 AM), Soft drinks (/), Tea ( /) ,no  Energy drinks ( including  those containing  taurine ). Caffeine is last consumed at 10 AM.  Exercises regularly.  Stretching, walking, dancing.  Hobbies :  her job.      Sleep habits and routines are as follows: The patient's dinner time is around 6-8 PM.  Evening time is spent by reading , watch TV, . The patient goes to bed at, or close to, 9.30-10 PM. The bedroom is some nights shared   with BF and is described as cool, quiet, and dark. The patient reports that it takes 20-30 minutes to fall asleep, then continues to sleep for 3-4 hours, interrupted or woken up by one bathroom break- , by the need to void (Nocturia).   The preferred sleep position is laterally,  left- with support of 2 pillows, (non- adjustable bed/ recliner ). The total estimated sleep time is circa 8-9  hours.  Dreams are reportedly rare/and can be vivid. Dream enactment has not been reported.   7-8 AM is the usual week- day rise time. The patient wakes up spontaneously at 8 AM  or with an alarm set at 7.00.  She reports mostly/ feeling refreshed and restored in the morning, but this doesn't a last -  Waking with symptoms such as dry mouth,some stiffness or pain, and fatigue.   No sleep  paralysis has been experienced.   Naps in daytime are taken infrequently (there is a desire to nap and opportunity), once a week - lasting from 30 to 60 and have a refreshing quality. These do not interfere with nocturnal sleep.     2017  Nina Martinez is a 73 y.o. female , seen here as a referral from Dr. Onita for a sleep evaluation, Nina Martinez has been witnessed to snore loudly, and she remembers as early as about 5 years ago when she was hospitalized for a GI bleed there were comments made by the nursing staff that she snored loudly. Apparently there was no report of apnea. Her former spouse had confirmed that she snored at home. In the meantime Nina. [place]'s fianc has been concerned about her irregular breathing. He has witnessed apneas and the  patient herself reports waking up gasping for air. She does not feel that her ears the past the same restorative quality it used to have. She lost her sister 6 years ago and developed an insomnia, and soon after went through a divorce. She was prescribed SSRI and Lunesta.    Sleep habits are as follows:   Going to bed between 10.30 and 11 Pm , she usually goes to sleep promptly on Lunesta. Her sleep lasts for 3-4 hours and she can go right back to sleep, having only one bathroom break if any. No night mares or vivid dreams.  She rises at 7 AM and wakes spontaneously but has a background alarm.  No headaches and no dry mouth in AM, but she feels tired when getting up. She has only one arousal for sleep, sleeping for 7 hours nightly. Her bedroom is cool, quiet and dark. She has adult children and no pets. She feels that sleep can come easy all times of the day. She doesn't struggle not to sleep. No sleep attacks.    Sleep medical history and family sleep history: no history of night terrors, one time sleep walker. No family history of a sleep disorder.      Social history: divorced, at this time engaged.  10 alcoholic beverages a week, no tobacco, caffeine use in AM and stops at lunch.   Review of Systems: Out of a complete 14 system review, the patient complains of only the following symptoms, and all other reviewed systems are negative. Snoring, apnea tired ness.    Epworth score  0 , Fatigue severity score 9  , PHq 2 depression score 0     Review of Systems: Out of a complete 14 system review, the patient complains of only the following symptoms, and all other reviewed systems are negative.:  Snoring    How likely are you to doze in the following situations: 0 = not likely, 1 = slight chance, 2 = moderate chance, 3 = high chance Sitting and Reading? Watching Television? Sitting inactive in a public place (theater or meeting)? As a passenger in a car for an hour without a break? Lying  down in the afternoon when circumstances permit? Sitting and talking to someone? Sitting quietly after lunch without alcohol ? In a car, while stopped for a few minutes in traffic?   Total ESS =5 / 24 points.    FSS endorsed at 34/ 63 points.  GDS: 4/ 15   Social History   Socioeconomic History   Marital status: Divorced    Spouse name: Not on file   Number of children: Not on file   Years of education: Not  on file   Highest education level: Not on file  Occupational History    Comment: [employer]  Tobacco Use   Smoking status: Former    Current packs/day: 0.00    Types: Cigarettes    Quit date: 02/11/1983    Years since quitting: 40.7    Passive exposure: Never   Smokeless tobacco: Never  Vaping Use   Vaping status: Never Used  Substance and Sexual Activity   Alcohol  use: Yes    Alcohol /week: 7.0 standard drinks of alcohol     Types: 7 Glasses of wine per week   Drug use: No   Sexual activity: Yes    Partners: Male    Birth control/protection: Post-menopausal    Comment: menarche 73yo, sexual debut 73yo, more than 5 lifetime partners, no hx STI, no DES exposure  Other Topics Concern   Not on file  Social History Narrative   Drinks 3-4 cups of coffee daily.   Social Drivers of Corporate investment banker Strain: Not on file  Food Insecurity: Not on file  Transportation Needs: Not on file  Physical Activity: Not on file  Stress: Not on file  Social Connections: Not on file    Family History  Problem Relation Age of Onset   Colon cancer Maternal Grandmother    Breast cancer Sister        Half Sister   Colon polyps Neg Hx    Esophageal cancer Neg Hx    Rectal cancer Neg Hx    Stomach cancer Neg Hx     Past Medical History:  Diagnosis Date   Allergy    seasonal   Anemia    Anxiety    Blood transfusion without reported diagnosis    5 Years ago due to internal bleeding   Depression    GERD (gastroesophageal reflux disease)     Past Surgical History:   Procedure Laterality Date   AUGMENTATION MAMMAPLASTY     BREAST ENHANCEMENT SURGERY  2000   silicone inplants   ESOPHAGOGASTRODUODENOSCOPY W/ BANDING  11/2008   duodenal divericular bleed-clipped   left hip replacement     TOTAL HIP ARTHROPLASTY Right 2023   UPPER GASTROINTESTINAL ENDOSCOPY       Current Outpatient Medications on File Prior to Visit  Medication Sig Dispense Refill   Ascorbic Acid (VITAMIN C PO) Take by mouth daily.      Celecoxib (CELEBREX PO) Take by mouth.     Cholecalciferol (VITAMIN D PO) Take by mouth daily.      escitalopram  (LEXAPRO ) 10 MG tablet TAKE ONE TABLET BY MOUTH EVERY DAY 90 tablet 3   estradiol  (VIVELLE -DOT) 0.0375 MG/24HR PLACE ONE PATCH onto THE SKIN TWO times a WEEK 24 patch 0   Eszopiclone 3 MG TABS      pantoprazole (PROTONIX) 40 MG tablet Take 40 mg by mouth daily.     progesterone  (PROMETRIUM ) 100 MG capsule Take 1 capsule (100 mg total) by mouth daily. 90 capsule 4   valACYclovir (VALTREX) 500 MG tablet Take 500 mg by mouth daily.     No current facility-administered medications on file prior to visit.    No Known Allergies  Vitals:   11/12/23 1503  BP: 128/86  Pulse: 68     Physical exam:   General: The patient was alert and appears not in acute distress.  Mood and affect are appropriate .  The patient's interactions are: Cooperative, makes eye contact, follows the instructions and answers questions coherently.  The patient  is groomed and appropriately groomed and dressed. Head: Normocephalic, atraumatic.  Neck is supple. Mallampati: 1-2.  The neck circumference measured 15 inches. Nasal airflow was not fully patent ,   Overbite / Retrognathia was noted.  Dental status: biological  Cardiovascular:  Regular rate and cardiac rhythm by palpable pulse. Respiratory: no audible wheezing, no tachypnoea.   Skin:  Without evidence of ankle edema. No discoloration.  Trunk:  BMI is 32 The patient's posture was erect.   Neurologic  exam : The patient was awake and alert, oriented to place and time.   Attention span & concentration ability appeared normal.  Speech was fluent, without dysarthria, dysphonia or aphasia, and of normal volume.     Cranial nerves:  There was no loss of smell or taste reported  Pupils are round, equal in size and briskly reactive to light.  Funduscopic exam was deferred. She reports floaters.  Extraocular movements in vertical and horizontal planes were intact and without nystagmus. (No Diplopia reported). Visual fields by finger perimetry are intact. Hearing was intact to soft voice.    Facial sensation intact to fine touch.  Facial motor strength: Symmetric movement and tongue and uvula move midline.  Neck ROM: rotation, tilt and flexion extension were intact for age and shoulder shrug was symmetrical.    Motor exam:  Symmetric bulk, strength and ROM.   Normal tone without cog- wheeling, and symmetric grip strength.   Sensory:  Fine touch and vibration were tested by tuning fork and intact.  Proprioception tested in the upper extremities was normal.   Coordination: The patient reported no problems with button closure and no changes to penmanship.   The Finger-to-nose maneuver was intact without evidence of ataxia, dysmetria or tremor.   Gait and station: Patient could rise unassisted from a seated position.   Deep tendon reflexes: Upper extremities  and Lower extremity DTRs were symmetric .     I would like to thank Onita Rush, MD and Stephane Leita DEL, Md [address] [place],  [place] [address] for allowing me to meet with Nina Brucker who is presenting with new onset HTN, continuous weight gain and fatigue, and snoring. She also suffers form GERD and this affects her sleep some nights.  ,  Risk factors for OSA were present, including : Body mass index is 31.87 kg/m., neck size 15  ,    My Plan is to proceed with:  Either HST or  PSG, I will order both and let her insurance decide  ( HTA) .   1)  This visit was directed towards finding out of sleep apnea is present and,  if  present, if the degree of apnea qualifies for the use of a weight loss drug such as Zepbound.  These medication may  still not be covered by health insurance.   I plan to follow up prn ( if apnea is present )personally or  through our NP within 4-6 months.   A total time of  45  minutes consistent of a part of face to face encounter , exam and interview,  and additional preparation time for chart review was spent .  At today's visit, we discussed treatment options, associated risk and benefits, and engage in counseling as needed including, but not limited to:  Sleep hygiene, Quality Sleep Habits, and Safety concerns for patients with daytime sleepiness who are warned to not operate machinery/ motor vehicles when drowsy.  Additionally, the following were reviewed: Past medical records, past medical and  surgical history, family and social background, as well as relevant laboratory results, imaging findings, and medical notes, where applicable.  This note was generated by myself in part by using dictation software, and as a result, it may contain unintentional typos and errors.  Nevertheless, effort was made to accurately convey the pertinent aspects of the patient's visit.   Dedra Gores, MD  [facility] Sleep Board certified in Sleep Medicine by The [employer] of Sleep Medicine and Diplomate of the [employer] of Sleep Medicine (AASM) . Board certified In Neurology, Diplomat of the ABPN,  Fellow of the [employer] of Neurology.

## 2023-11-23 DIAGNOSIS — B0089 Other herpesviral infection: Secondary | ICD-10-CM | POA: Diagnosis not present

## 2023-11-23 DIAGNOSIS — L309 Dermatitis, unspecified: Secondary | ICD-10-CM | POA: Diagnosis not present

## 2023-11-26 ENCOUNTER — Telehealth: Payer: Self-pay | Admitting: Neurology

## 2023-11-26 NOTE — Telephone Encounter (Signed)
 HTA NPSG pending

## 2023-12-03 ENCOUNTER — Ambulatory Visit (INDEPENDENT_AMBULATORY_CARE_PROVIDER_SITE_OTHER): Admitting: Radiology

## 2023-12-03 ENCOUNTER — Encounter: Payer: Self-pay | Admitting: Radiology

## 2023-12-03 VITALS — BP 136/84 | HR 74 | Ht 59.25 in | Wt 158.0 lb

## 2023-12-03 DIAGNOSIS — Z9189 Other specified personal risk factors, not elsewhere classified: Secondary | ICD-10-CM

## 2023-12-03 DIAGNOSIS — Z79890 Hormone replacement therapy (postmenopausal): Secondary | ICD-10-CM | POA: Diagnosis not present

## 2023-12-03 DIAGNOSIS — Z01419 Encounter for gynecological examination (general) (routine) without abnormal findings: Secondary | ICD-10-CM

## 2023-12-03 DIAGNOSIS — Z13820 Encounter for screening for osteoporosis: Secondary | ICD-10-CM

## 2023-12-03 MED ORDER — ESTRADIOL 0.0375 MG/24HR TD PTTW: 1.0000 | MEDICATED_PATCH | TRANSDERMAL | 4 refills | Status: AC

## 2023-12-03 MED ORDER — PROGESTERONE MICRONIZED 100 MG PO CAPS: 100.0000 mg | ORAL_CAPSULE | Freq: Every day | ORAL | 4 refills | Status: AC

## 2023-12-03 NOTE — Progress Notes (Signed)
   Nina Martinez Mar 30, 1950 [id number]   History: Postmenopausal 73 y.o. presents for medicare breast and pelvic exam. Doing well, no concerns. Denies any bleeding. No pain with intercourse. She denies any problems with HRT, would like to continue, aware of any associated risks.  Risk Factors for Medicare Patients >/= 5 sexual partners in a lifetime: Yes First intercourse <58 years of age: Yes H/O STD at any age: No Abnormal pap smear, < 3 negative paps within the last 7 years: No DES exposure (women born between [id number]): No Patient is on post breast cancer medication like Femara or, if medication like this is not needed, 5 years post breast cancer diagnosis: No   Gynecologic History Postmenopausal Last Pap: 2018. Results were: normal, paps d/c'd Last mammogram: 3/25. Results were: normal Colonoscopy: 2022 DEXA: 2018 HRT use: current, no concerns  Obstetric History OB History  Gravida Para Term Preterm AB Living  3 2 2  1 2   SAB IAB Ectopic Multiple Live Births  1        # Outcome Date GA Lbr Len/2nd Weight Sex Type Anes PTL Lv  3 SAB           2 Term           1 Term              The following portions of the patient's history were reviewed and updated as appropriate: allergies, current medications, past family history, past medical history, past social history, past surgical history, and problem list.  Review of Systems Pertinent items noted in HPI and remainder of comprehensive ROS otherwise negative.  Past medical history, past surgical history, family history and social history were all reviewed and documented in the EPIC chart.  Exam:  Vitals:   12/03/23 1040 12/03/23 1045  BP: (!) 142/90 136/84  Pulse: 74   SpO2: 97%   Weight: 158 lb (71.7 kg)   Height: 4' 11.25 (1.505 m)    Body mass index is 31.64 kg/m.  General appearance:  Normal Abdominal  Soft,nontender, without masses, guarding or rebound.  Liver/spleen:  No organomegaly noted  Hernia:  None  appreciated  Skin  Inspection:  Grossly normal Breasts: Examined lying and sitting.   Right: Without masses, retractions, nipple discharge or axillary adenopathy.   Left: Without masses, retractions, nipple discharge or axillary adenopathy. Genitourinary   Inguinal/mons:  Normal without inguinal adenopathy  External genitalia:  Normal appearing vulva with no masses, tenderness, or lesions  BUS/Urethra/Skene's glands:  Normal  Vagina:  Normal appearing with normal color and discharge, no lesions. Atrophy: mild   Cervix:  Normal appearing without discharge or lesions  Uterus:  Normal in size, shape and contour.  Midline and mobile, nontender  Adnexa/parametria:     Rt: Normal in size, without masses or tenderness.   Lt: Normal in size, without masses or tenderness.  Anus and perineum: Normal    Darice Hoit, CMA present for exam  Assessment/Plan:   1. Encounter for breast and pelvic examination (Primary)  2. Hormone replacement therapy - progesterone  (PROMETRIUM ) 100 MG capsule; Take 1 capsule (100 mg total) by mouth daily.  Dispense: 90 capsule; Refill: 4 - estradiol  (VIVELLE -DOT) 0.0375 MG/24HR; Place 1 patch onto the skin 2 (two) times a week.  Dispense: 24 patch; Refill: 4   3. Screening for osteoporosis - DG Bone Density; Future    Return in 1 year for HR B&P.  Zya Finkle B WHNP-BC, 10:47 AM 12/03/2023

## 2023-12-07 NOTE — Telephone Encounter (Signed)
 Still pending turn around time to hear if its approve by 12/10/23

## 2023-12-08 ENCOUNTER — Telehealth (HOSPITAL_BASED_OUTPATIENT_CLINIC_OR_DEPARTMENT_OTHER): Payer: Self-pay

## 2023-12-14 ENCOUNTER — Other Ambulatory Visit: Payer: Self-pay | Admitting: Radiology

## 2023-12-14 DIAGNOSIS — F32A Depression, unspecified: Secondary | ICD-10-CM

## 2023-12-14 NOTE — Telephone Encounter (Signed)
 Med refill request:   escitalopram  (LEXAPRO ) 10 MG tablet  Start:  12/18/22 Disp:  90 tablets Refills:  3  Last AEX:  12/03/23 Next AEX:  Not yet scheduled Last MMG (if hormonal med):  N/A Refill authorized? Please Advise.

## 2023-12-14 NOTE — Telephone Encounter (Signed)
 NPSG HTA auth: [id number] (exp. 11/26/23 to 02/24/24)

## 2023-12-16 NOTE — Telephone Encounter (Signed)
 12/15/23 LVM [place]

## 2023-12-29 ENCOUNTER — Telehealth (HOSPITAL_BASED_OUTPATIENT_CLINIC_OR_DEPARTMENT_OTHER): Payer: Self-pay

## 2024-01-04 DIAGNOSIS — E785 Hyperlipidemia, unspecified: Secondary | ICD-10-CM | POA: Diagnosis not present

## 2024-01-04 DIAGNOSIS — Z1212 Encounter for screening for malignant neoplasm of rectum: Secondary | ICD-10-CM | POA: Diagnosis not present

## 2024-01-14 DIAGNOSIS — Z1331 Encounter for screening for depression: Secondary | ICD-10-CM | POA: Diagnosis not present

## 2024-01-14 DIAGNOSIS — Z0000 Encounter for general adult medical examination without abnormal findings: Secondary | ICD-10-CM | POA: Diagnosis not present

## 2024-01-14 DIAGNOSIS — B351 Tinea unguium: Secondary | ICD-10-CM | POA: Diagnosis not present

## 2024-01-14 DIAGNOSIS — E785 Hyperlipidemia, unspecified: Secondary | ICD-10-CM | POA: Diagnosis not present

## 2024-01-14 DIAGNOSIS — N898 Other specified noninflammatory disorders of vagina: Secondary | ICD-10-CM | POA: Diagnosis not present

## 2024-01-14 DIAGNOSIS — R030 Elevated blood-pressure reading, without diagnosis of hypertension: Secondary | ICD-10-CM | POA: Diagnosis not present

## 2024-01-14 DIAGNOSIS — M549 Dorsalgia, unspecified: Secondary | ICD-10-CM | POA: Diagnosis not present

## 2024-01-14 DIAGNOSIS — F325 Major depressive disorder, single episode, in full remission: Secondary | ICD-10-CM | POA: Diagnosis not present

## 2024-01-14 DIAGNOSIS — G4700 Insomnia, unspecified: Secondary | ICD-10-CM | POA: Diagnosis not present

## 2024-01-14 DIAGNOSIS — Z1389 Encounter for screening for other disorder: Secondary | ICD-10-CM | POA: Diagnosis not present

## 2024-01-14 DIAGNOSIS — G4733 Obstructive sleep apnea (adult) (pediatric): Secondary | ICD-10-CM | POA: Diagnosis not present

## 2024-01-14 DIAGNOSIS — R946 Abnormal results of thyroid function studies: Secondary | ICD-10-CM | POA: Diagnosis not present

## 2024-01-14 DIAGNOSIS — M25559 Pain in unspecified hip: Secondary | ICD-10-CM | POA: Diagnosis not present

## 2024-01-14 DIAGNOSIS — I77810 Thoracic aortic ectasia: Secondary | ICD-10-CM | POA: Diagnosis not present

## 2024-01-21 DIAGNOSIS — R82998 Other abnormal findings in urine: Secondary | ICD-10-CM | POA: Diagnosis not present

## 2024-03-16 ENCOUNTER — Other Ambulatory Visit: Payer: Self-pay | Admitting: Radiology

## 2024-03-16 DIAGNOSIS — F32A Depression, unspecified: Secondary | ICD-10-CM

## 2024-03-16 NOTE — Telephone Encounter (Signed)
 Med refill request:   escitalopram  (LEXAPRO ) 10 MG tablet  Start:  12/14/23 Disp: 90  tablets Refills:  0  Last B&P:  12/03/23   Next B&P:  Not yet scheduled Last MMG (if hormonal med):  04/23/23 Refill authorized? Please Advise.
